# Patient Record
Sex: Female | Born: 1985 | Race: White | Hispanic: No | Marital: Married
Health system: Midwestern US, Community
[De-identification: ages and names within clinical notes are randomized; demographics above are authoritative.]

## PROBLEM LIST (undated history)

## (undated) ENCOUNTER — Inpatient Hospital Stay (HOSPITAL_COMMUNITY): Payer: Self-pay

## (undated) DIAGNOSIS — G43019 Migraine without aura, intractable, without status migrainosus: Principal | ICD-10-CM

## (undated) DIAGNOSIS — G43909 Migraine, unspecified, not intractable, without status migrainosus: Secondary | ICD-10-CM

## (undated) DIAGNOSIS — D649 Anemia, unspecified: Secondary | ICD-10-CM

## (undated) DIAGNOSIS — J45909 Unspecified asthma, uncomplicated: Secondary | ICD-10-CM

## (undated) HISTORY — PX: OTHER SURGICAL HISTORY: SHX169

## (undated) HISTORY — DX: Migraine without aura, intractable, without status migrainosus: G43.019

---

## 2009-07-24 NOTE — Procedures (Signed)
Test Reason : PRE/OP CARDIOVASCULAR EXAM   Blood Pressure : ***/*** mmHG   Vent. Rate : 081 BPM     Atrial Rate : 081 BPM      P-R Int : 136 ms          QRS Dur : 082 ms       QT Int : 370 ms       P-R-T Axes : 079 088 059 degrees      QTc Int : 429 ms   Normal sinus rhythm with sinus arrhythmia   Normal ECG   No previous ECGs available   Confirmed by Francisco Capuchin, M.D., Mohit ([id number]) on 07/24/2009 7:43:00 PM   Referred By:             Overread By: Osa Craver, M.D.

## 2009-07-26 NOTE — Op Note (Signed)
[facility]   Operation Report   NAME:  Destiny Wolf, Destiny Wolf   SEX:   F   DATE: 07/26/2009   DOB: [birth year]   MR#    [id number]   ROOM:     ACCT#  [id number]       cc: Dr. Guadlupe Spanish , Carola Rhine MD       PREOPERATIVE DIAGNOSES:   1. Right-sided pain with mild right hydronephrosis.   2. Lower urinary tract symptoms.   3. Ovarian cysts.       POSTOPERATIVE DIAGNOSES:   1. No significant obstruction of right renal drainage system.   2. Areas of erythema of the bladder.   3. Ovarian cysts.       ANESTHESIA:   General       SURGEON:   Mickel Fuchs, M.D.       PROCEDURE:   Cystoscopy, bilateral retrograde pyelograms, bladder biopsy and fulguration    (small).       NOTE:   This is a 24 year old who had been evaluated by  Dr. Jacklyn Shell.  She has had    symptoms including right-sided pain and has imaging study including CT scan    with mild right dilatation involving primarily the pelvis of right kidney with    minimal right ureteral dilatation.  She also has had lower urinary tract    symptoms of frequency, urgency and sensation is incomplete emptying.  Her pain    has included the right flank, right abdomen and pelvic regions.  She does    have ovarian cysts.  Her procedures have included previous ovarian cyst    removal.  She has had a more recent lap appendectomy.         See H&amp;P by Dr. Terrace Arabia regarding nature of findings and planned procedure.  I did    call the patient last night and reviewed this morning preoperatively the    nature of procedure and risks.       PROCEDURE:   The procedure was done under general anesthesia in lithotomy position after    prep, drape and time-out under fluoroscopic monitoring.         Cystoscopy was performed.  Examination of the bladder with the 30 and 70    degree lens revealed two areas of erythema; one on the left lateral wall and    one on the posterior wall.  These most likely represent inflammatory changes.     No tumors were present in the bladder.  Both ureteral orifices  appeared    normal.       Bilateral retrograde pyelograms were done using the 5-French open-ended and    half-strength contrast.  On the left side, no filling defects and no    hydronephrosis.  On the right side, no filling defects.  There was proper    drainage of the ureter, which was not significantly dilated.  Minimal fullness    of the right renal pelvis without dilatation of the calyces.  There was good    drainage of the right renal pelvis.  Following removal of the 5-French    open-ended, there was good drainage of both the ureter and drainage from the    renal pelvis without evidence of significant obstruction.       Biopsies were obtained using cold-cup from the two areas of erythema of the    bladder.  These areas were thoroughly fulgurated.  The biopsy specimens were    sent for  pathologic assessment.  The bladder was drained.  The patient was    given a B&amp;O suppository.  The patient tolerated the procedure well and was    returned to the recovery room in good condition.       PLAN:   The plan is to give prescription for VESIcare to see if this can help with her    bladder symptoms pending results of the biopsies and her clinical course.           ___________________   P. Genevie Cheshire MD   Dictated By:.    ec   D:07/26/2009   T: 07/27/2009 08:65:78   [id number]

## 2010-02-09 ENCOUNTER — Ambulatory Visit: Payer: Self-pay | Admitting: Gynecology

## 2010-02-09 ENCOUNTER — Inpatient Hospital Stay (HOSPITAL_COMMUNITY): Admission: AD | Admit: 2010-02-09 | Discharge: 2010-02-09 | Payer: Self-pay | Admitting: Obstetrics and Gynecology

## 2010-05-09 ENCOUNTER — Inpatient Hospital Stay (HOSPITAL_COMMUNITY)
Admission: AD | Admit: 2010-05-09 | Discharge: 2010-05-09 | Payer: Self-pay | Source: Home / Self Care | Attending: Obstetrics and Gynecology | Admitting: Obstetrics and Gynecology

## 2010-07-12 LAB — GC/CHLAMYDIA PROBE AMP, GENITAL: Chlamydia, DNA Probe: NEGATIVE

## 2010-07-12 LAB — URINALYSIS, ROUTINE W REFLEX MICROSCOPIC
Bilirubin Urine: NEGATIVE
Ketones, ur: NEGATIVE mg/dL
Nitrite: NEGATIVE
Specific Gravity, Urine: >1.03 — ABNORMAL HIGH (ref 1.005–1.030)
pH: 6 (ref 5.0–8.0)

## 2010-07-12 LAB — DIFFERENTIAL
Basophils Absolute: 0 10*3/uL (ref 0.0–0.1)
Basophils Relative: 1 % (ref 0–1)
Eosinophils Absolute: 0 10*3/uL (ref 0.0–0.7)
Eosinophils Relative: 0 % (ref 0–5)
Monocytes Absolute: 0.4 10*3/uL (ref 0.1–1.0)
Monocytes Relative: 5 % (ref 3–12)

## 2010-07-12 LAB — CBC
HCT: 36.3 % (ref 36.0–46.0)
Hemoglobin: 12.7 g/dL (ref 12.0–15.0)
MCH: 33 pg (ref 26.0–34.0)
MCHC: 35 g/dL (ref 30.0–36.0)
MCV: 94.4 fL (ref 78.0–100.0)
RDW: 13.2 % (ref 11.5–15.5)

## 2010-07-12 LAB — WET PREP, GENITAL
Trich, Wet Prep: NONE SEEN
Yeast Wet Prep HPF POC: NONE SEEN

## 2010-07-12 LAB — URINE MICROSCOPIC-ADD ON

## 2010-08-19 ENCOUNTER — Inpatient Hospital Stay (HOSPITAL_COMMUNITY)
Admission: AD | Admit: 2010-08-19 | Discharge: 2010-08-19 | Disposition: A | Discharge status: Home / Self Care | Payer: Medicaid Other | Source: Ambulatory Visit | Attending: Obstetrics and Gynecology | Admitting: Obstetrics and Gynecology

## 2010-08-19 DIAGNOSIS — R109 Unspecified abdominal pain: Secondary | ICD-10-CM | POA: Insufficient documentation

## 2010-08-19 DIAGNOSIS — O4700 False labor before 37 completed weeks of gestation, unspecified trimester: Secondary | ICD-10-CM | POA: Insufficient documentation

## 2010-08-19 LAB — URINE MICROSCOPIC-ADD ON

## 2010-08-19 LAB — URINALYSIS, ROUTINE W REFLEX MICROSCOPIC
Glucose, UA: NEGATIVE mg/dL
Hgb urine dipstick: NEGATIVE
Ketones, ur: 40 mg/dL — AB
Leukocytes, UA: NEGATIVE
Protein, ur: >300 mg/dL — AB
pH: 6 (ref 5.0–8.0)

## 2010-08-19 LAB — CBC
Hemoglobin: 11.4 g/dL — ABNORMAL LOW (ref 12.0–15.0)
MCHC: 34.1 g/dL (ref 30.0–36.0)
WBC: 15.3 10*3/uL — ABNORMAL HIGH (ref 4.0–10.5)

## 2010-10-06 ENCOUNTER — Inpatient Hospital Stay (HOSPITAL_COMMUNITY)
Admission: AD | Admit: 2010-10-06 | Discharge: 2010-10-08 | DRG: 775 | Disposition: A | Discharge status: Home / Self Care | Payer: Medicaid Other | Source: Ambulatory Visit | Attending: Obstetrics and Gynecology | Admitting: Obstetrics and Gynecology

## 2010-10-06 LAB — CBC
Hemoglobin: 11.7 g/dL — ABNORMAL LOW (ref 12.0–15.0)
MCV: 92.9 fL (ref 78.0–100.0)
Platelets: 252 10*3/uL (ref 150–400)
RBC: 3.64 MIL/uL — ABNORMAL LOW (ref 3.87–5.11)
WBC: 8.4 10*3/uL (ref 4.0–10.5)

## 2010-10-06 LAB — RPR: RPR Ser Ql: NONREACTIVE

## 2010-10-07 LAB — CBC
HCT: 29.8 % — ABNORMAL LOW (ref 36.0–46.0)
Hemoglobin: 10.2 g/dL — ABNORMAL LOW (ref 12.0–15.0)
MCH: 32.2 pg (ref 26.0–34.0)
RBC: 3.17 MIL/uL — ABNORMAL LOW (ref 3.87–5.11)

## 2011-05-29 IMAGING — US US OB TRANSVAGINAL MODIFY
1 series · 14 of 28 positions shown · non-contrast
Comparison: none

OBSTETRICAL ULTRASOUND:
 This ultrasound exam was performed in the [HOSPITAL] Ultrasound Department.  The OB US report was generated in the AS system, and faxed to the ordering physician.  This report is also available in [HOSPITAL]?s Access[employer]ware and in [REDACTED] PACS.

[Series 1: us ob comp less 14 wks · 0.21mm/px · 14 of 51 slices shown]
[im 2/51]
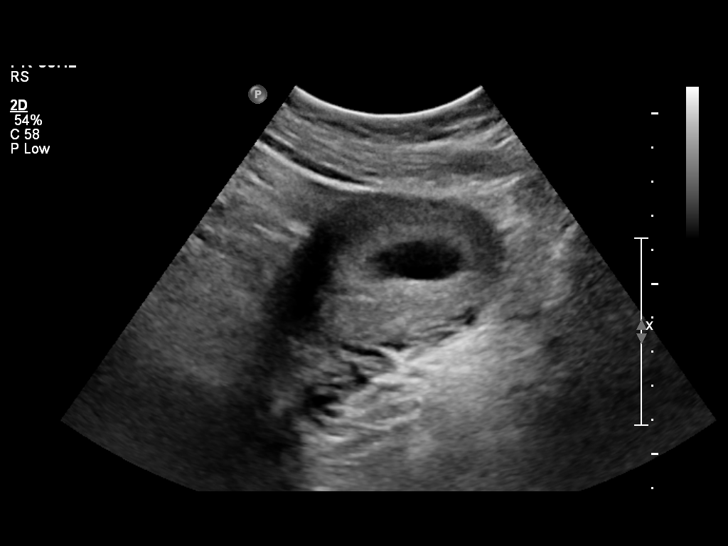
[im 6/51]
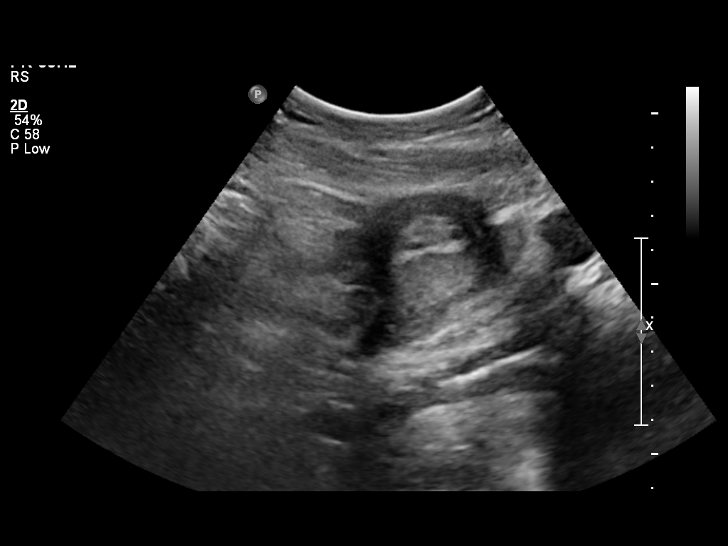
[im 10/51]
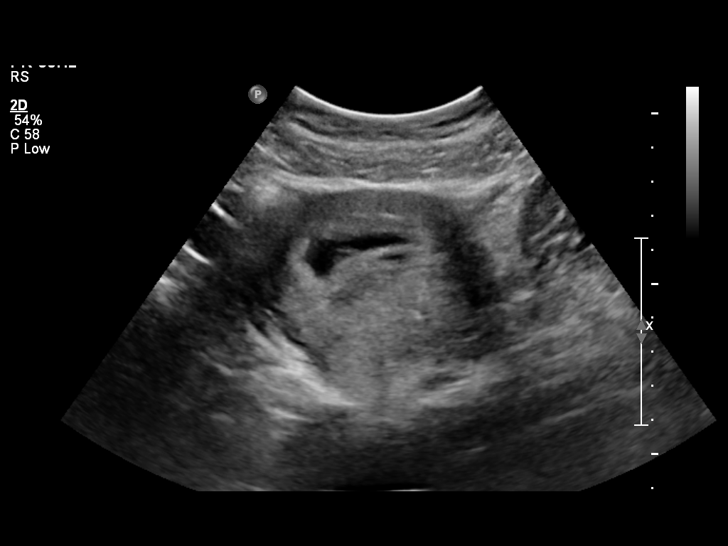
[im 13/51]
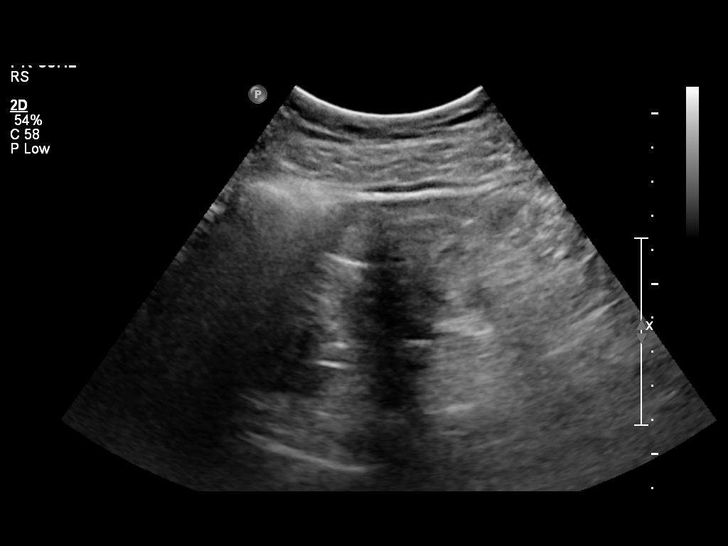
[im 17/51]
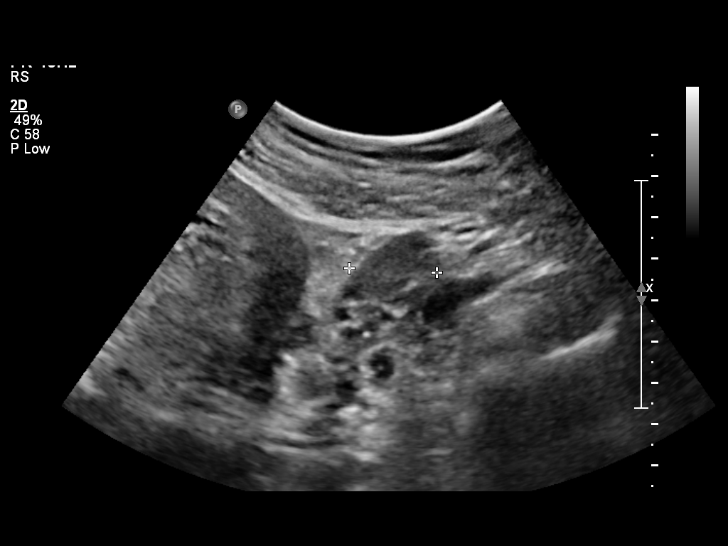
[im 21/51]
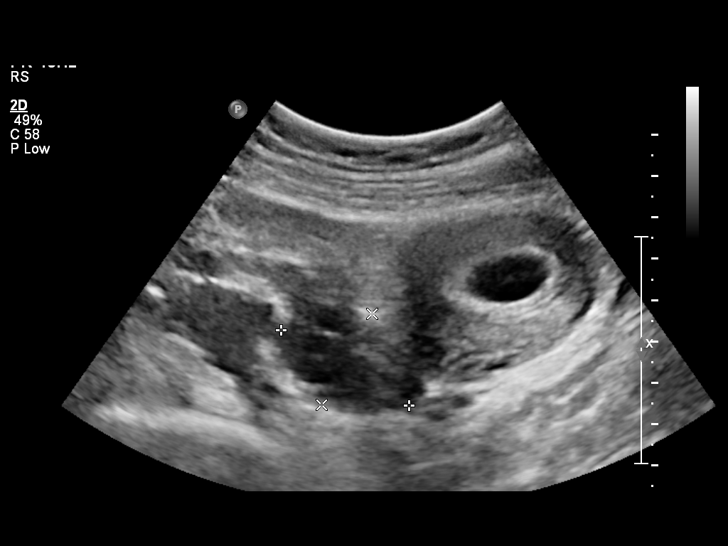
[im 25/51]
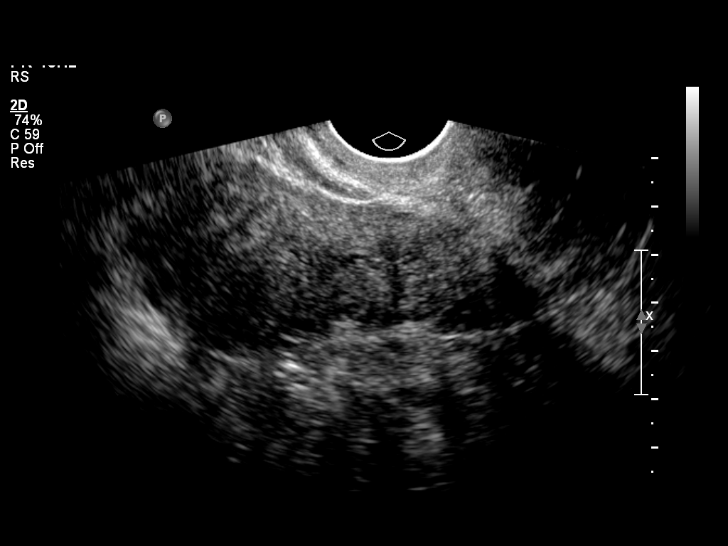
[im 28/51]
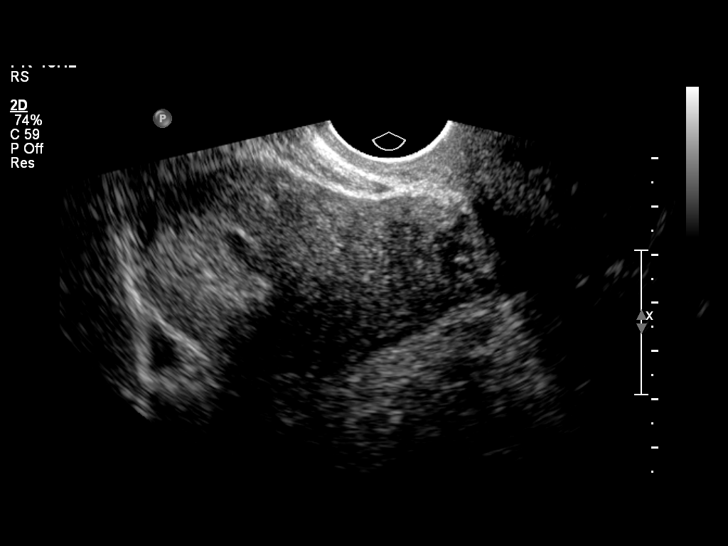
[im 32/51]
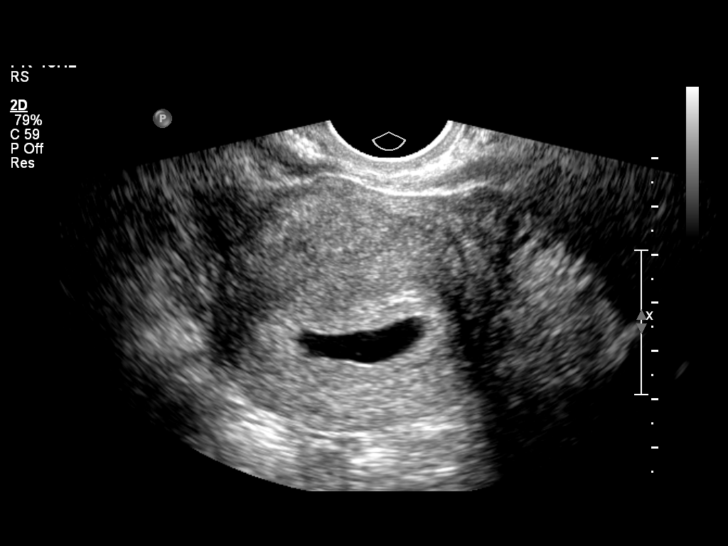
[im 36/51]
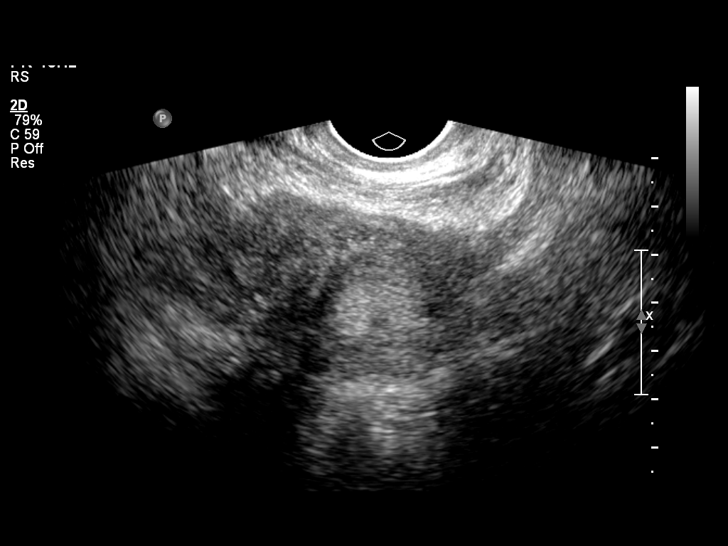
[im 39/51]
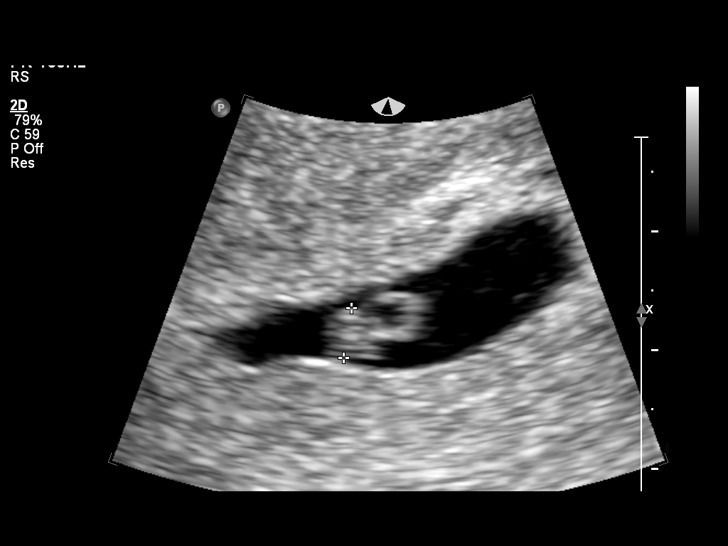
[im 43/51]
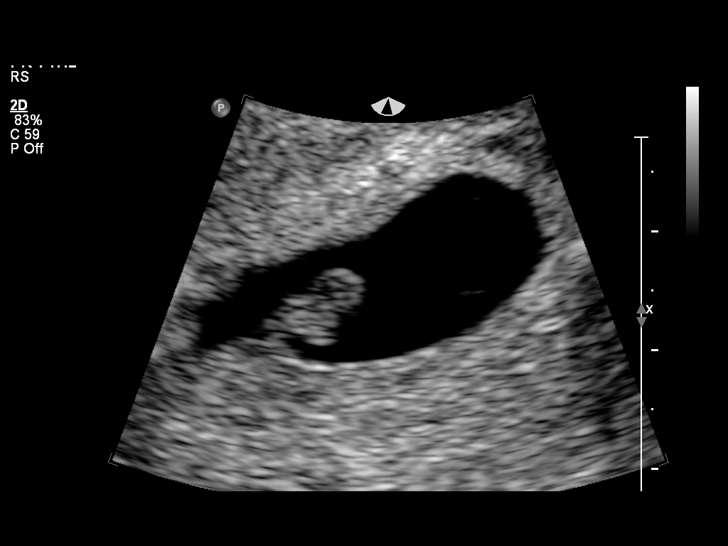
[im 47/51]
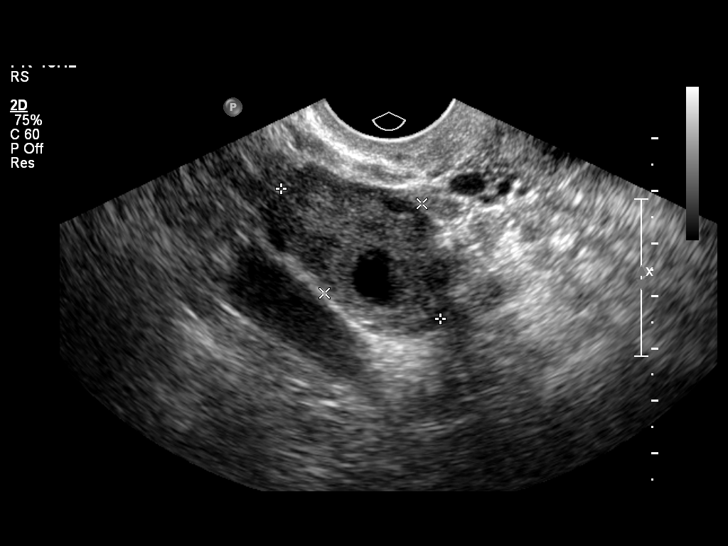
[im 51/51]
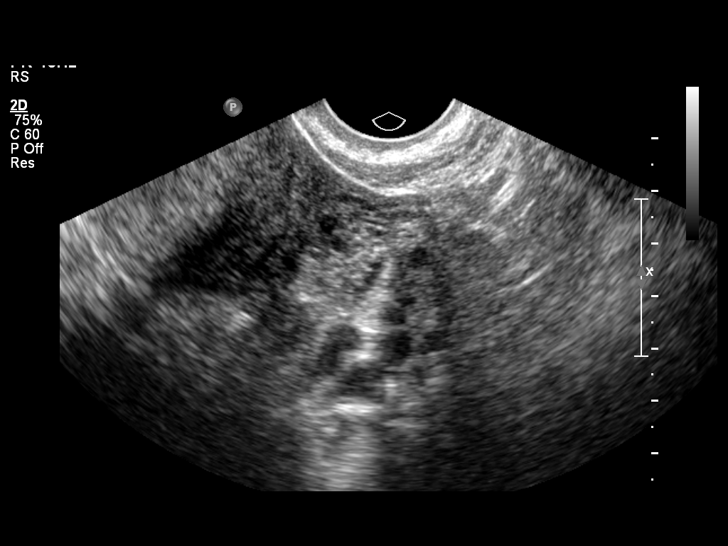

[14 of 28 positions shown; findings below may reference images not displayed]

IMPRESSION: See AS Obstetric US report.

## 2011-11-21 ENCOUNTER — Inpatient Hospital Stay (HOSPITAL_COMMUNITY)
Admission: AD | Admit: 2011-11-21 | Discharge: 2011-11-22 | Disposition: A | Discharge status: Home / Self Care | Payer: Medicaid Other | Source: Ambulatory Visit | Attending: Obstetrics & Gynecology | Admitting: Obstetrics & Gynecology

## 2011-11-21 ENCOUNTER — Encounter (HOSPITAL_COMMUNITY): Payer: Self-pay | Admitting: *Deleted

## 2011-11-21 DIAGNOSIS — O200 Threatened abortion: Secondary | ICD-10-CM

## 2011-11-21 DIAGNOSIS — O209 Hemorrhage in early pregnancy, unspecified: Secondary | ICD-10-CM | POA: Insufficient documentation

## 2011-11-21 HISTORY — DX: Unspecified asthma, uncomplicated: J45.909

## 2011-11-21 HISTORY — DX: Anemia, unspecified: D64.9

## 2011-11-21 LAB — WET PREP, GENITAL
Clue Cells Wet Prep HPF POC: NONE SEEN
Trich, Wet Prep: NONE SEEN

## 2011-11-21 LAB — CBC
MCH: 31.4 pg (ref 26.0–34.0)
MCHC: 34.5 g/dL (ref 30.0–36.0)
Platelets: 265 10*3/uL (ref 150–400)
RDW: 12.8 % (ref 11.5–15.5)

## 2011-11-21 NOTE — MAU Note (Signed)
Pt states she started having vaginal bleeding. Pt states she had some bleeding with her first son, but feels this bleeding is" pretty heavy"

## 2011-11-21 NOTE — MAU Provider Note (Signed)
History     CSN: [id number]  Arrival date and time: 11/21/11 2111   First Provider Initiated Contact with Patient 11/21/11 2240      Chief Complaint  Patient presents with  . Vaginal Bleeding   HPI Linda Kirk is a 26 y.o. female @ [redacted]w[redacted]d gestation who presents to MAU for vaginal bleeding. The bleeding started approximately 7 pm. She describes the bleeding as heavier than a period. Has not used any pads. Noted the bleeding while in the tub. Last sexual intercourse yesterday. Has not started prenatal care. Plans care with Dr. Henderson Cloud but has not had a GYN visit with any one since her last baby. Did not go for a 6 week check up after her last delivery. Unsure of last pap smear. No history of STI's. The history was provided by the patient.   OB History    Grav Para Term Preterm Abortions TAB SAB Ect Mult Living   5 2 2  0 2 0 2 0 0 2      Past Medical History  Diagnosis Date  . Asthma   . Anemia     Past Surgical History  Procedure Date  . Pediatric surgery   . Pyloric stenosis     Family History  Problem Relation Age of Onset  . Hypertension Father   . Hyperlipidemia Father     History  Substance Use Topics  . Smoking status: Never Smoker   . Smokeless tobacco: Not on file  . Alcohol Use: No    Allergies:  Allergies  Allergen Reactions  . Penicillins Anaphylaxis    No prescriptions prior to admission    Review of Systems  Constitutional: Negative for fever, chills and weight loss.  HENT: Negative for ear pain, nosebleeds, congestion, sore throat and neck pain.   Eyes: Negative for blurred vision, double vision, photophobia and pain.  Respiratory: Negative for cough, shortness of breath and wheezing.   Cardiovascular: Negative for chest pain, palpitations and leg swelling.  Gastrointestinal: Negative for heartburn, nausea, vomiting, abdominal pain (cramping ), diarrhea and constipation.  Genitourinary: Negative for dysuria, urgency and frequency.    Musculoskeletal: Positive for back pain. Negative for myalgias.  Skin: Negative for itching and rash.  Neurological: Negative for dizziness, sensory change, speech change, seizures, weakness and headaches.  Endo/Heme/Allergies: Does not bruise/bleed easily.  Psychiatric/Behavioral: Negative for depression. The patient is not nervous/anxious and does not have insomnia.    Physical Exam   Blood pressure 115/87, pulse 119, temperature 99.3 F (37.4 C), temperature source Oral, resp. rate 18, height 5\' 5"  (1.651 m), weight 163 lb 3.2 oz (74.027 kg), last menstrual period 09/04/2011.  Physical Exam  Nursing note and vitals reviewed. Constitutional: She is oriented to person, place, and time. She appears well-developed and well-nourished. No distress.  HENT:  Head: Normocephalic and atraumatic.  Eyes: EOM are normal.  Neck: Neck supple.  Cardiovascular: Regular rhythm.   Respiratory: Effort normal.  GI: Soft. There is no tenderness.  Genitourinary:       External genitalia without lesions. Moderate blood vaginal vault. Cervix closed, long, no CMT, no adnexal tenderness or mass palpated. Uterus slightly enlarged.  Musculoskeletal: Normal range of motion.  Neurological: She is alert and oriented to person, place, and time.  Skin: Skin is warm and dry.  Psychiatric: She has a normal mood and affect. Her behavior is normal. Judgment and thought content normal.   Results for orders placed during the hospital encounter of 11/21/11 (from the past 24  hour(s))  WET PREP, GENITAL     Status: Abnormal   Collection Time   11/21/11 11:05 PM      Component Value Range   Yeast Wet Prep HPF POC NONE SEEN  NONE SEEN   Trich, Wet Prep NONE SEEN  NONE SEEN   Clue Cells Wet Prep HPF POC NONE SEEN  NONE SEEN   WBC, Wet Prep HPF POC FEW (*) NONE SEEN  CBC     Status: Normal   Collection Time   11/21/11 11:15 PM      Component Value Range   WBC 9.4  4.0 - 10.5 K/uL   RBC 4.05  3.87 - 5.11 MIL/uL    Hemoglobin 12.7  12.0 - 15.0 g/dL   HCT 08.6  57.8 - 46.9 %   MCV 90.9  78.0 - 100.0 fL   MCH 31.4  26.0 - 34.0 pg   MCHC 34.5  30.0 - 36.0 g/dL   RDW 62.9  52.8 - 41.3 %   Platelets 265  150 - 400 K/uL  HCG, QUANTITATIVE, PREGNANCY     Status: Abnormal   Collection Time   11/21/11 11:15 PM      Component Value Range   hCG, Beta Chain, Quant, [place] [address] (*) <5 mIU/mL     Procedures Ultrasound tonight shows a 11 week 2 days viable with large [facility]  Assessment: 26 y.o. female with 11.2 week viable IUP   Large [facility]  Plan:  Pelvic rest   Start prenatal care   Return as needed.  I have reviewed this patient's vital signs, nurses notes, appropriate labs and imaging. I have discussed in detail with the patient results of lab and ultrasound and plan of care. Patient voices understanding.    Namiah Dunnavant, RN, FNP, [facility] 11/21/2011, 10:46 PM

## 2011-11-21 NOTE — MAU Note (Signed)
For the past two hours been having bleeding that woke me up out of sleep.  Bleeding like a heavy menstrual period.  Cramping

## 2011-11-21 NOTE — Progress Notes (Signed)
Pt states she had N&V earlier

## 2011-11-21 NOTE — Progress Notes (Signed)
Pt states she fell earlier today.

## 2011-11-22 ENCOUNTER — Inpatient Hospital Stay (HOSPITAL_COMMUNITY): Payer: Medicaid Other

## 2011-11-22 LAB — HCG, QUANTITATIVE, PREGNANCY: hCG, Beta Chain, Quant, S: 76164 m[IU]/mL — ABNORMAL HIGH (ref <5)

## 2011-11-22 LAB — GC/CHLAMYDIA PROBE AMP, GENITAL
Chlamydia, DNA Probe: NEGATIVE
GC Probe Amp, Genital: NEGATIVE

## 2011-11-22 NOTE — MAU Provider Note (Signed)
Attestation of Attending Supervision of Advanced Practitioner (CNM/NP): Evaluation and management procedures were performed by the Advanced Practitioner under my supervision and collaboration.  I have reviewed the Advanced Practitioner's note and chart, and I agree with the management and plan.  Taysom Glymph, M.D. 11/22/2011 7:58 AM  

## 2012-12-11 ENCOUNTER — Inpatient Hospital Stay (HOSPITAL_COMMUNITY)
Admission: AD | Admit: 2012-12-11 | Discharge: 2012-12-11 | Disposition: A | Discharge status: Home / Self Care | Payer: Medicaid Other | Source: Ambulatory Visit | Attending: Obstetrics & Gynecology | Admitting: Obstetrics & Gynecology

## 2012-12-11 ENCOUNTER — Encounter (HOSPITAL_COMMUNITY): Payer: Self-pay

## 2012-12-11 DIAGNOSIS — Z3201 Encounter for pregnancy test, result positive: Secondary | ICD-10-CM

## 2012-12-11 DIAGNOSIS — E876 Hypokalemia: Secondary | ICD-10-CM | POA: Insufficient documentation

## 2012-12-11 DIAGNOSIS — R55 Syncope and collapse: Secondary | ICD-10-CM

## 2012-12-11 DIAGNOSIS — O2650 Maternal hypotension syndrome, unspecified trimester: Secondary | ICD-10-CM | POA: Insufficient documentation

## 2012-12-11 LAB — URINALYSIS, ROUTINE W REFLEX MICROSCOPIC
Bilirubin Urine: NEGATIVE
Ketones, ur: NEGATIVE mg/dL
Nitrite: NEGATIVE
Protein, ur: NEGATIVE mg/dL
Urobilinogen, UA: 0.2 mg/dL (ref 0.0–1.0)

## 2012-12-11 LAB — COMPREHENSIVE METABOLIC PANEL
Alkaline Phosphatase: 47 U/L (ref 39–117)
BUN: 5 mg/dL — ABNORMAL LOW (ref 6–23)
CO2: 24 mEq/L (ref 19–32)
Chloride: 100 mEq/L (ref 96–112)
Creatinine, Ser: 0.53 mg/dL (ref 0.50–1.10)
GFR calc Af Amer: >90 mL/min (ref >90)
GFR calc non Af Amer: >90 mL/min (ref >90)
Glucose, Bld: 84 mg/dL (ref 70–99)
Potassium: 3.1 mEq/L — ABNORMAL LOW (ref 3.5–5.1)
Total Bilirubin: 0.4 mg/dL (ref 0.3–1.2)

## 2012-12-11 LAB — CBC
HCT: 32.6 % — ABNORMAL LOW (ref 36.0–46.0)
Hemoglobin: 11.7 g/dL — ABNORMAL LOW (ref 12.0–15.0)
MCV: 89.8 fL (ref 78.0–100.0)
RBC: 3.63 MIL/uL — ABNORMAL LOW (ref 3.87–5.11)
RDW: 12.8 % (ref 11.5–15.5)
WBC: 8.1 10*3/uL (ref 4.0–10.5)

## 2012-12-11 MED ORDER — POTASSIUM CHLORIDE CRYS ER 20 MEQ PO TBCR: 40.0000 meq | EXTENDED_RELEASE_TABLET | Freq: Two times a day (BID) | ORAL | Status: DC

## 2012-12-11 NOTE — MAU Note (Signed)
Patient states she has had no prenatal care. States she was at work at [employer] and had a period where she got really hot. States she became alert sitting in a chair and a coworker states she was able to answer questions and the episode lasted about 20 minutes. Patient denies pain, bleeding or discharge. Had one episode of vomiting this am.

## 2012-12-11 NOTE — MAU Provider Note (Signed)
Chief Complaint: Near Syncope   None    SUBJECTIVE HPI: Linda Kirk is a 27 y.o. [id number] at [redacted]w[redacted]d by LMP who presents to maternity admissions reporting episode at work tonight with possible loss of consciousness.  Pt reports she has had near syncopal episodes x4-5 in last few weeks but today felt hot, flushed, and remembers starting a task at work, then was sitting down in a chair with her manager beside her.  The episode was witnessed by a co-worker who reports the pt was talking during the episode and did not pass out or fall.  She denies LOF, vaginal bleeding, vaginal itching/burning, urinary symptoms, h/a, dizziness, n/v, or fever/chills.      Past Medical History  Diagnosis Date  . Asthma   . Anemia    Past Surgical History  Procedure Laterality Date  . Pediatric surgery    . Pyloric stenosis     History   Social History  . Marital Status: Single    Spouse Name: N/A    Number of Children: N/A  . Years of Education: N/A   Occupational History  . Not on file.   Social History Main Topics  . Smoking status: Never Smoker   . Smokeless tobacco: Not on file  . Alcohol Use: No  . Drug Use: No  . Sexual Activity: Yes   Other Topics Concern  . Not on file   Social History Narrative  . No narrative on file   No current facility-administered medications on file prior to encounter.   No current outpatient prescriptions on file prior to encounter.   Allergies  Allergen Reactions  . Penicillins Anaphylaxis    ROS: Pertinent items in HPI  OBJECTIVE Blood pressure 116/77, pulse 96, temperature 99.3 F (37.4 C), temperature source Oral, resp. rate 16, height 5\' 5"  (1.651 m), weight 70.67 kg (155 lb 12.8 oz), last menstrual period 08/06/2012, SpO2 100.00%. GENERAL: Well-developed, well-nourished female in no acute distress.  HEENT: Normocephalic HEART: normal rate RESP: normal effort ABDOMEN: Soft, non-tender EXTREMITIES: Nontender, no edema NEURO: Alert and  oriented SPECULUM EXAM: Deferred  FHT 140s by doppler per RN  LAB RESULTS  Results for orders placed during the hospital encounter of 12/11/12 (from the past 168 hour(s))  URINALYSIS, ROUTINE W REFLEX MICROSCOPIC   Collection Time    12/11/12  4:30 PM      Result Value Range   Color, Urine YELLOW  YELLOW   APPearance CLEAR  CLEAR   Specific Gravity, Urine 1.010  1.005 - 1.030   pH 6.5  5.0 - 8.0   Glucose, UA NEGATIVE  NEGATIVE mg/dL   Hgb urine dipstick NEGATIVE  NEGATIVE   Bilirubin Urine NEGATIVE  NEGATIVE   Ketones, ur NEGATIVE  NEGATIVE mg/dL   Protein, ur NEGATIVE  NEGATIVE mg/dL   Urobilinogen, UA 0.2  0.0 - 1.0 mg/dL   Nitrite NEGATIVE  NEGATIVE   Leukocytes, UA NEGATIVE  NEGATIVE  CBC   Collection Time    12/11/12  6:10 PM      Result Value Range   WBC 8.1  4.0 - 10.5 K/uL   RBC 3.63 (*) 3.87 - 5.11 MIL/uL   Hemoglobin 11.7 (*) 12.0 - 15.0 g/dL   HCT 45.4 (*) 09.8 - 11.9 %   MCV 89.8  78.0 - 100.0 fL   MCH 32.2  26.0 - 34.0 pg   MCHC 35.9  30.0 - 36.0 g/dL   RDW 14.7  82.9 - 56.2 %   Platelets  230  150 - 400 K/uL  COMPREHENSIVE METABOLIC PANEL   Collection Time    12/11/12  6:10 PM      Result Value Range   Sodium 134 (*) 135 - 145 mEq/L   Potassium 3.1 (*) 3.5 - 5.1 mEq/L   Chloride 100  96 - 112 mEq/L   CO2 24  19 - 32 mEq/L   Glucose, Bld 84  70 - 99 mg/dL   BUN 5 (*) 6 - 23 mg/dL   Creatinine, Ser 1.61  0.50 - 1.10 mg/dL   Calcium 9.3  8.4 - 09.6 mg/dL   Total Protein 6.8  6.0 - 8.3 g/dL   Albumin 3.5  3.5 - 5.2 g/dL   AST 14  0 - 37 U/L   ALT 6  0 - 35 U/L   Alkaline Phosphatase 47  39 - 117 U/L   Total Bilirubin 0.4  0.3 - 1.2 mg/dL   GFR calc non Af Amer >90  >90 mL/min   GFR calc Af Amer >90  >90 mL/min     ASSESSMENT 1. Hypokalemia   2. Near syncope   3. Positive pregnancy test     PLAN Discharge home K-Dur 40 meq BID F/U as planned with [facility] anatomy U/S scheduled Return to MAU as needed    Medication  List    ASK your doctor about these medications       prenatal multivitamin Tabs tablet  Take 1 tablet by mouth daily at 12 noon.         Follow-up Information   Schedule an appointment as soon as possible for a visit with [facility]. (Or prenatal provider of pt choice. U/S will call you to schedule appointment or call [phone].  Return to MAU as needed.)    Contact information:   [address] [place] [address] [phone]      Sharen Counter Certified Nurse-Midwife 12/11/2012  5:43 PM

## 2012-12-14 ENCOUNTER — Ambulatory Visit (HOSPITAL_COMMUNITY)
Admission: RE | Admit: 2012-12-14 | Discharge: 2012-12-14 | Disposition: A | Discharge status: Home / Self Care | Payer: Medicaid Other | Source: Ambulatory Visit | Attending: Advanced Practice Midwife | Admitting: Advanced Practice Midwife

## 2012-12-14 DIAGNOSIS — Z363 Encounter for antenatal screening for malformations: Secondary | ICD-10-CM | POA: Insufficient documentation

## 2012-12-14 DIAGNOSIS — O358XX0 Maternal care for other (suspected) fetal abnormality and damage, not applicable or unspecified: Secondary | ICD-10-CM | POA: Insufficient documentation

## 2012-12-14 DIAGNOSIS — Z3201 Encounter for pregnancy test, result positive: Secondary | ICD-10-CM

## 2012-12-14 DIAGNOSIS — Z1389 Encounter for screening for other disorder: Secondary | ICD-10-CM | POA: Insufficient documentation

## 2012-12-17 NOTE — MAU Provider Note (Signed)
Attestation of Attending Supervision of Advanced Practitioner (CNM/NP): Evaluation and management procedures were performed by the Advanced Practitioner under my supervision and collaboration.  I have reviewed the Advanced Practitioner's note and chart, and I agree with the management and plan.  HARRAWAY-SMITH, Jebidiah Baggerly 2:08 PM       

## 2013-01-22 ENCOUNTER — Inpatient Hospital Stay (HOSPITAL_COMMUNITY)
Admission: AD | Admit: 2013-01-22 | Discharge: 2013-01-22 | Disposition: A | Discharge status: Home / Self Care | Payer: Medicaid Other | Source: Ambulatory Visit | Attending: Obstetrics & Gynecology | Admitting: Obstetrics & Gynecology

## 2013-01-22 ENCOUNTER — Encounter (HOSPITAL_COMMUNITY): Payer: Self-pay | Admitting: *Deleted

## 2013-01-22 DIAGNOSIS — M549 Dorsalgia, unspecified: Secondary | ICD-10-CM | POA: Insufficient documentation

## 2013-01-22 DIAGNOSIS — O99891 Other specified diseases and conditions complicating pregnancy: Secondary | ICD-10-CM | POA: Insufficient documentation

## 2013-01-22 DIAGNOSIS — R109 Unspecified abdominal pain: Secondary | ICD-10-CM | POA: Insufficient documentation

## 2013-01-22 LAB — URINALYSIS, ROUTINE W REFLEX MICROSCOPIC
Ketones, ur: NEGATIVE mg/dL
Leukocytes, UA: NEGATIVE
Protein, ur: NEGATIVE mg/dL
Urobilinogen, UA: 0.2 mg/dL (ref 0.0–1.0)

## 2013-01-22 LAB — WET PREP, GENITAL: Yeast Wet Prep HPF POC: NONE SEEN

## 2013-01-22 MED ORDER — ACETAMINOPHEN 500 MG PO TABS: 500.0000 mg | ORAL_TABLET | Freq: Four times a day (QID) | ORAL | Status: DC | PRN

## 2013-01-22 NOTE — MAU Note (Signed)
Patient states she has had no prenatal care pending Medicaid approval. Plans to go to [facility]. Patient states she has been having lower abdominal and low back pain for several days and nausea today. Denies bleeding or discharge. Has felt fetal movement.

## 2013-01-22 NOTE — MAU Provider Note (Signed)
History     CSN: [id number]  Arrival date and time: 01/22/13 1635   None     Chief Complaint  Patient presents with  . Abdominal Pain  . Back Pain  . Nausea   HPI Comments: CIARA KAGAN is a 27 y.o. [phone] at [redacted]w[redacted]d who presents with mild pain in back and abdomen. She describes the pain as crampy and coming and going. It started a couple of days ago and moves from her lower abdomen to her back. The pain occurs every 10-15 minutes. She also states that she has felt contractions a few nights ago. She reports no vaginal bleeding, discharge or leaking of fluid. She has felt good fetal movement. She is currently feeling a little pain in sides of her abdomen. She is trying to get prenatal care at [facility] but has had problems with insurance.   OB History   Grav Para Term Preterm Abortions TAB SAB Ect Mult Living   5 2 2  0 2 0 2 0 0 2      Past Medical History  Diagnosis Date  . Asthma   . Anemia     Past Surgical History  Procedure Laterality Date  . Pediatric surgery    . Pyloric stenosis      Family History  Problem Relation Age of Onset  . Hypertension Father   . Hyperlipidemia Father     History  Substance Use Topics  . Smoking status: Never Smoker   . Smokeless tobacco: Not on file  . Alcohol Use: No    Allergies:  Allergies  Allergen Reactions  . Penicillins Anaphylaxis    Prescriptions prior to admission  Medication Sig Dispense Refill  . Prenatal Vit-Fe Fumarate-FA (PRENATAL MULTIVITAMIN) TABS tablet Take 1 tablet by mouth daily at 12 noon.        Review of Systems  Gastrointestinal: Positive for abdominal pain.  Genitourinary: Negative for dysuria.  Musculoskeletal: Positive for back pain.   Physical Exam   Blood pressure 120/73, pulse 102, temperature 99.1 F (37.3 C), temperature source Oral, resp. rate 16, height 5' 4.5" (1.638 m), weight 73.029 kg (161 lb), last menstrual period 08/06/2012, SpO2 100.00%.  Physical Exam   Constitutional: She is oriented to person, place, and time. She appears well-developed and well-nourished. No distress.  Cardiovascular: Normal rate, regular rhythm, normal heart sounds and intact distal pulses.   Respiratory: Effort normal and breath sounds normal. No respiratory distress. She has no wheezes. She has no rales.  GI: Soft. Bowel sounds are normal. There is no tenderness. There is no rebound and no guarding.  Genitourinary: No bleeding around the vagina. Vaginal discharge found.  Musculoskeletal: Normal range of motion. She exhibits no edema and no tenderness.  Neurological: She is alert and oriented to person, place, and time.  Skin: Skin is warm and dry. She is not diaphoretic. No erythema.   Cervix: Closed, thick FHT: 135, moderate variability, +accels, +variable decels, Category II tracing UC:  None  MAU Course  Procedures  MDM - Urinalysis and wet prep obtained and were not suggestive of infection - Gonorrhea/chlamydia pending - Cervix checked and closed. No LOF of foul odors noticed on speculum exam  Assessment and Plan  DARLISHA KELM is a 27 y.o. [id number] at [redacted]w[redacted]d who presents with cramping.  Plan - counseled patient to keep well hydrated with water - encouraged patient to come to clinic downstairs if not able to establish prenatal care soon - tylenol for pain - counseled  to return to MAU if symptoms persist or worsen  Jacquelin Hawking 01/22/2013, 5:48 PM   I spoke with and examined patient and agree with resident's note and plan of care. No obvious etiology for occasional pelvic pressure. May be related to atypical round ligament pain. No evidence of preterm labor, and pt has no evidence of infection. Encouraged pt to establish prenatal care. Pt agrees and will make appt with our clinic if unable to establish by 28wks.  Tawana Scale, MD OB Fellow

## 2013-02-04 NOTE — MAU Provider Note (Signed)
Attestation of Attending Supervision of Fellow: Evaluation and management procedures were performed by the Fellow under my supervision and collaboration.  I have reviewed the Fellow's note and chart, and I agree with the management and plan.    

## 2013-02-15 LAB — OB RESULTS CONSOLE GC/CHLAMYDIA
Chlamydia: NEGATIVE
GC PROBE AMP, GENITAL: NEGATIVE

## 2013-02-19 LAB — OB RESULTS CONSOLE ANTIBODY SCREEN: ANTIBODY SCREEN: NEGATIVE

## 2013-02-19 LAB — OB RESULTS CONSOLE ABO/RH: RH Type: POSITIVE

## 2013-02-19 LAB — OB RESULTS CONSOLE HIV ANTIBODY (ROUTINE TESTING): HIV: NONREACTIVE

## 2013-02-19 LAB — OB RESULTS CONSOLE RPR: RPR: NONREACTIVE

## 2013-02-19 LAB — OB RESULTS CONSOLE RUBELLA ANTIBODY, IGM: Rubella: IMMUNE

## 2013-02-19 LAB — OB RESULTS CONSOLE HEPATITIS B SURFACE ANTIGEN: Hepatitis B Surface Ag: NEGATIVE

## 2013-03-10 IMAGING — US US OB COMP LESS 14 WK
1 series · 14 of 21 positions shown · non-contrast
Comparison: None.

CLINICAL DATA: Vaginal bleeding.  History of spontaneous abortion.
Beta HCG level [DATE].

OBSTETRIC <14 WK ULTRASOUND
TECHNIQUE: Transabdominal ultrasound was performed for evaluation
of the gestation as well as the maternal uterus and adnexal
regions.

[Series 1: us ob comp less 14 wks · 14 of 21 slices shown]
[im 1/21]
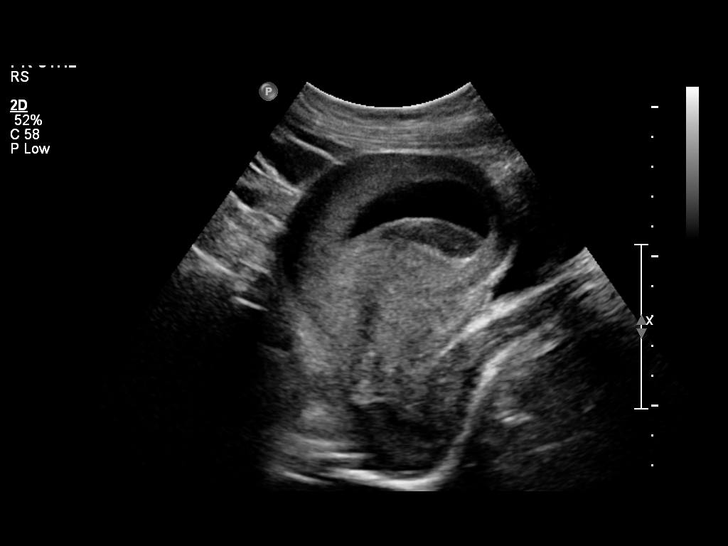
[im 3/21]
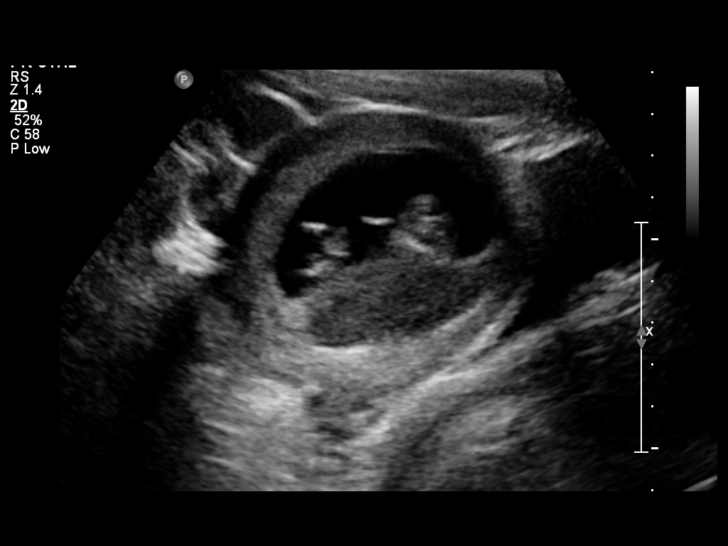
[im 4/21]
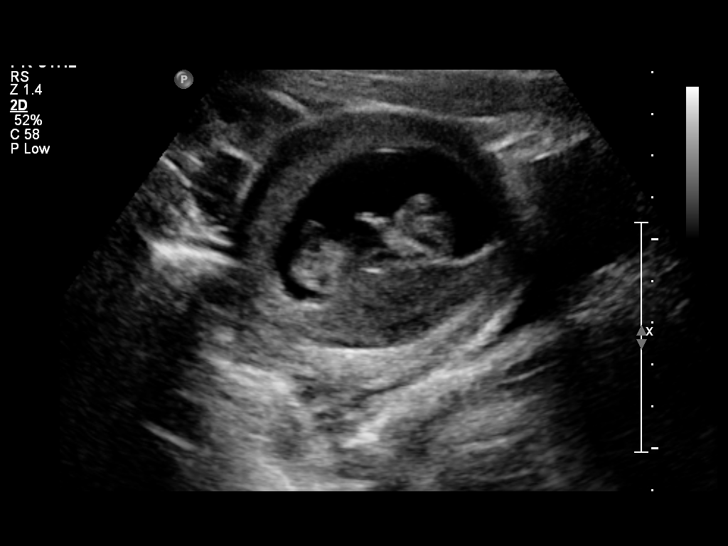
[im 6/21]
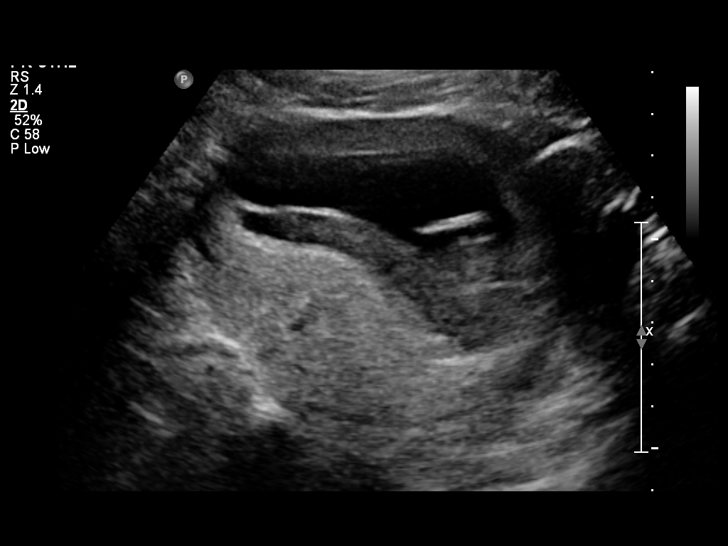
[im 7/21]
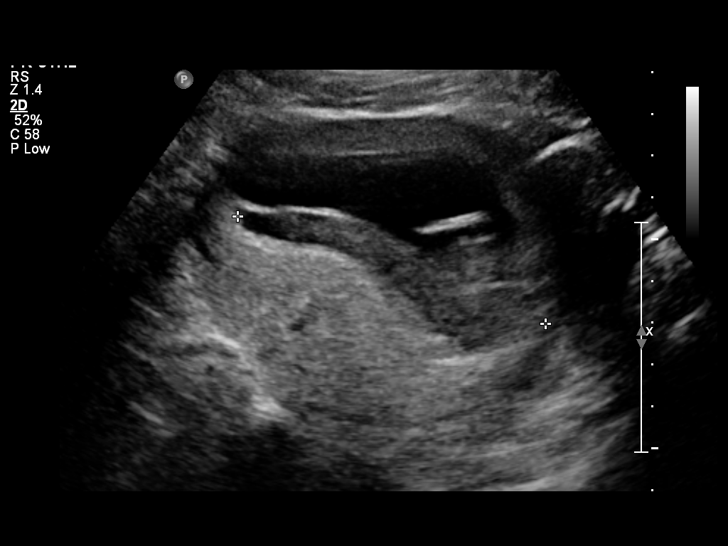
[im 9/21]
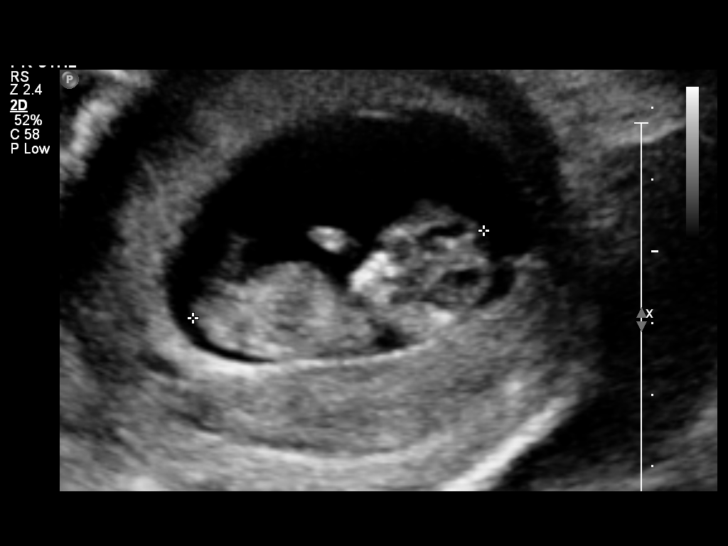
[im 10/21]
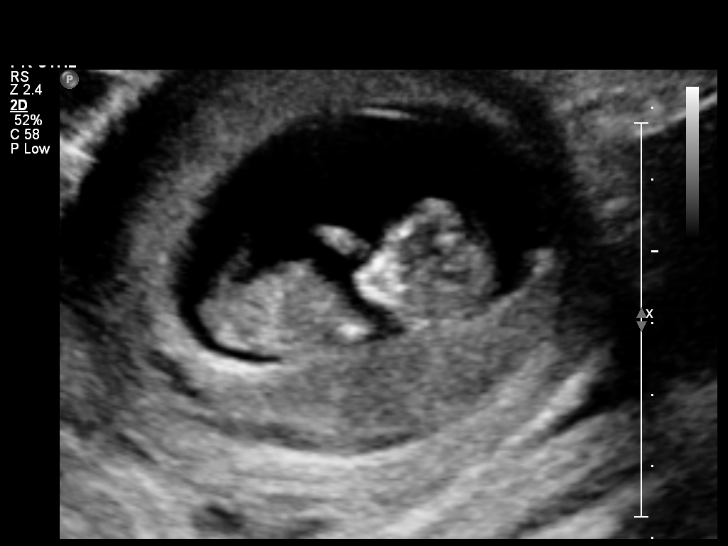
[im 12/21]
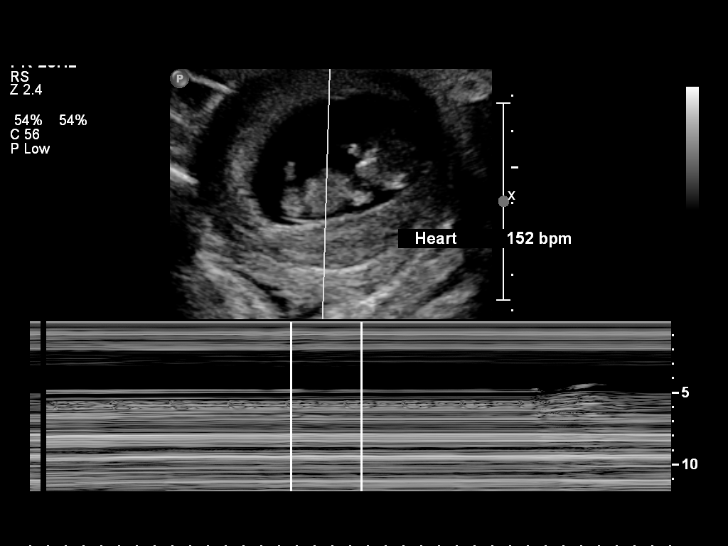
[im 13/21]
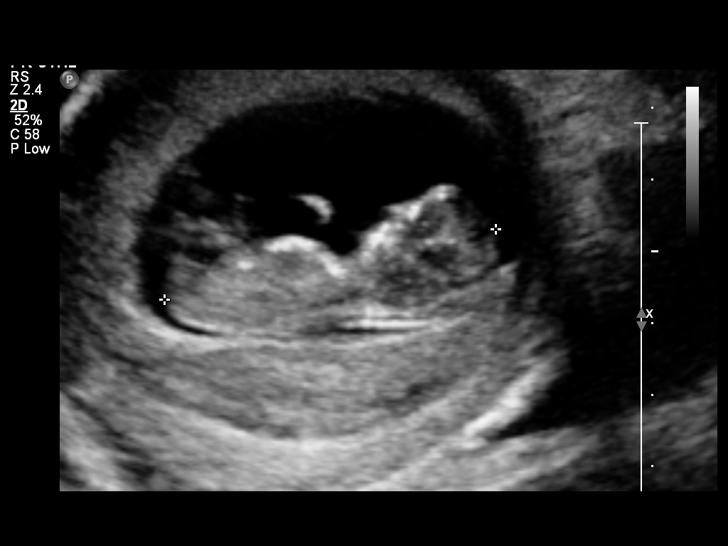
[im 15/21]
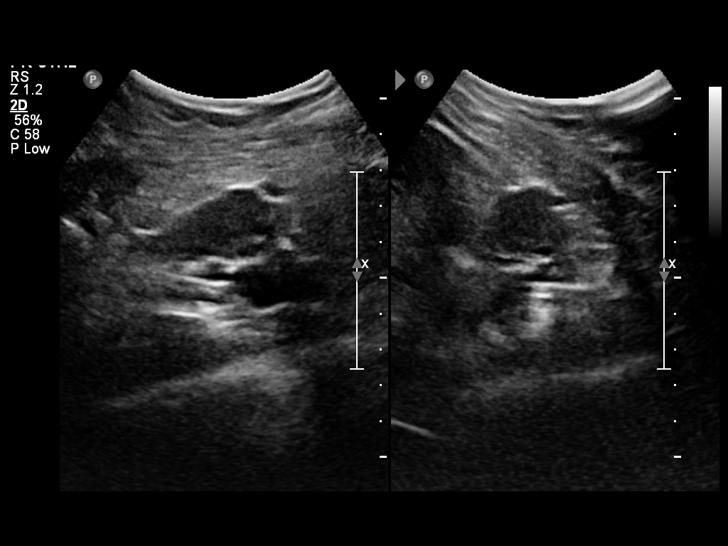
[im 16/21]
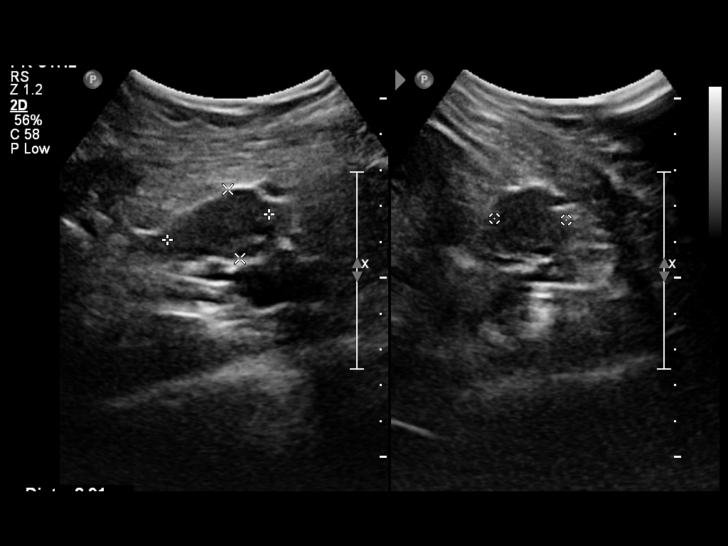
[im 18/21]
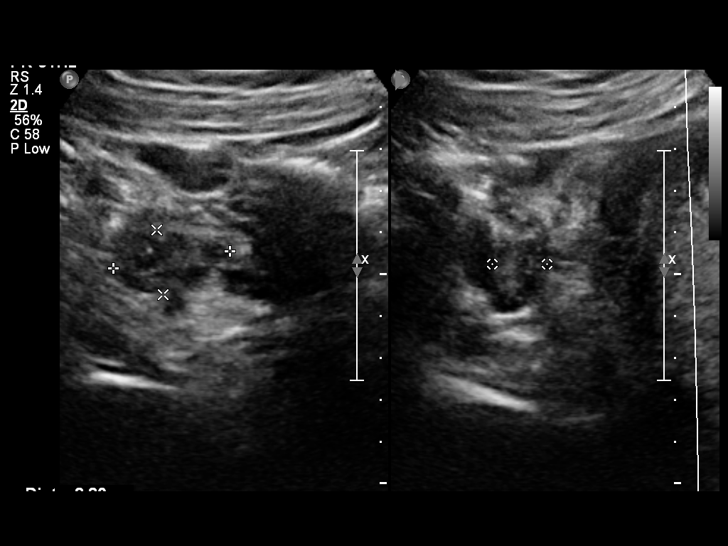
[im 19/21]
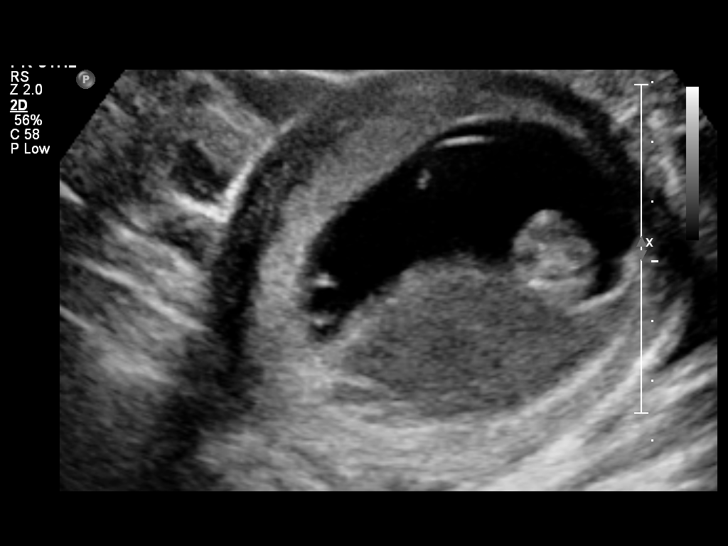
[im 21/21]
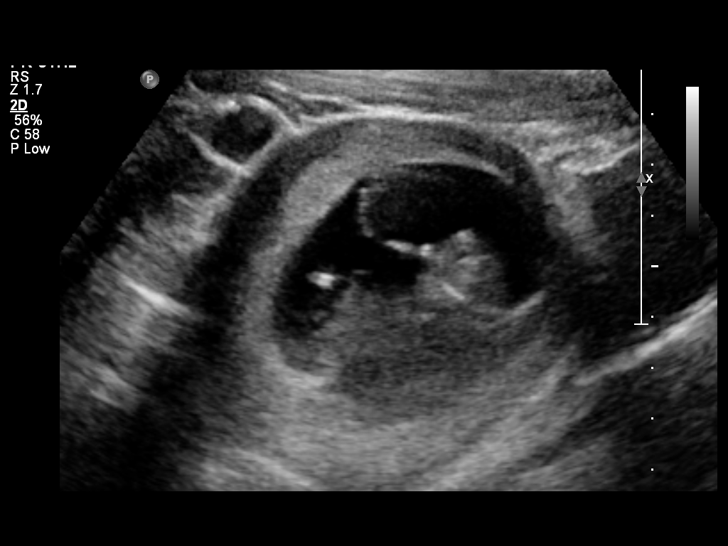

[14 of 21 positions shown; findings below may reference images not displayed]

Intrauterine gestational sac: Visualized/normal in shape.
Yolk sac: Visualized.
Embryo: Single.
Cardiac Activity: Present.
Heart Rate: 152 bpm

CRL:  4.4 cm; 11 weeks 2 days.            US EDC: 06/10/2012.

Maternal uterus/Adnexae:
There is a moderate to large subchorionic hematoma, measuring
approximately 5.2 x 1.7 x 7.8 cm.  Both maternal ovaries appear
normal.  There is no free pelvic fluid.
IMPRESSION: 1.  Moderate to large subchorionic hematoma.
2.  Single live intrauterine gestation with best estimated
gestational age of 11 weeks 2 days.

## 2013-04-19 LAB — OB RESULTS CONSOLE GBS: GBS: POSITIVE

## 2013-04-29 NOTE — L&D Delivery Note (Signed)
Patient was C/C/+3 and pushed for 5 minutes with epidural.   NSVD  female infant, Apgars 8,9, weight P.   Meconium seen but baby vigorous immediately. The patient had no lacerations. Fundus was firm. EBL was expected. Placenta was delivered intact. Vagina was clear.  Baby was vigorous and doing skin to skin with mother.  Linda Kirk A

## 2013-05-07 ENCOUNTER — Inpatient Hospital Stay (HOSPITAL_COMMUNITY): Payer: Medicaid Other | Admitting: Anesthesiology

## 2013-05-07 ENCOUNTER — Encounter (HOSPITAL_COMMUNITY): Payer: Self-pay | Admitting: *Deleted

## 2013-05-07 ENCOUNTER — Inpatient Hospital Stay (HOSPITAL_COMMUNITY)
Admission: AD | Admit: 2013-05-07 | Discharge: 2013-05-09 | DRG: 775 | Disposition: A | Discharge status: Home / Self Care | Payer: Medicaid Other | Source: Ambulatory Visit | Attending: Obstetrics and Gynecology | Admitting: Obstetrics and Gynecology

## 2013-05-07 ENCOUNTER — Encounter (HOSPITAL_COMMUNITY): Payer: Medicaid Other | Admitting: Anesthesiology

## 2013-05-07 DIAGNOSIS — O9989 Other specified diseases and conditions complicating pregnancy, childbirth and the puerperium: Principal | ICD-10-CM

## 2013-05-07 DIAGNOSIS — O99892 Other specified diseases and conditions complicating childbirth: Principal | ICD-10-CM | POA: Diagnosis present

## 2013-05-07 DIAGNOSIS — Z22330 Carrier of Group B streptococcus: Secondary | ICD-10-CM

## 2013-05-07 LAB — CBC
HEMATOCRIT: 33.2 % — AB (ref 36.0–46.0)
Hemoglobin: 11.5 g/dL — ABNORMAL LOW (ref 12.0–15.0)
MCH: 30.1 pg (ref 26.0–34.0)
MCHC: 34.6 g/dL (ref 30.0–36.0)
MCV: 86.9 fL (ref 78.0–100.0)
Platelets: 266 10*3/uL (ref 150–400)
RBC: 3.82 MIL/uL — ABNORMAL LOW (ref 3.87–5.11)
RDW: 12.9 % (ref 11.5–15.5)
WBC: 8.1 10*3/uL (ref 4.0–10.5)

## 2013-05-07 LAB — RPR: RPR Ser Ql: NONREACTIVE

## 2013-05-07 LAB — POCT FERN TEST: POCT Fern Test: POSITIVE

## 2013-05-07 MED ORDER — VANCOMYCIN HCL IN DEXTROSE 1-5 GM/200ML-% IV SOLN
1000.0000 mg | Freq: Two times a day (BID) | INTRAVENOUS | Status: DC
  Administered 2013-05-07: 1000 mg via INTRAVENOUS
  Filled 2013-05-07 (×2): qty 200

## 2013-05-07 MED ORDER — DIBUCAINE 1 % RE OINT
1.0000 "application " | TOPICAL_OINTMENT | RECTAL | Status: DC | PRN
  Filled 2013-05-07: qty 28

## 2013-05-07 MED ORDER — METHYLERGONOVINE MALEATE 0.2 MG/ML IJ SOLN: 0.2000 mg | INTRAMUSCULAR | Status: DC | PRN

## 2013-05-07 MED ORDER — CITRIC ACID-SODIUM CITRATE 334-500 MG/5ML PO SOLN: 30.0000 mL | ORAL | Status: DC | PRN

## 2013-05-07 MED ORDER — MAGNESIUM HYDROXIDE 400 MG/5ML PO SUSP: 30.0000 mL | ORAL | Status: DC | PRN

## 2013-05-07 MED ORDER — ACETAMINOPHEN 325 MG PO TABS: 650.0000 mg | ORAL_TABLET | ORAL | Status: DC | PRN

## 2013-05-07 MED ORDER — OXYCODONE-ACETAMINOPHEN 5-325 MG PO TABS: 1.0000 | ORAL_TABLET | ORAL | Status: DC | PRN

## 2013-05-07 MED ORDER — LACTATED RINGERS IV SOLN
INTRAVENOUS | Status: DC
  Administered 2013-05-07: 05:00:00 via INTRAVENOUS

## 2013-05-07 MED ORDER — MEASLES, MUMPS & RUBELLA VAC ~~LOC~~ INJ: 0.5000 mL | INJECTION | Freq: Once | SUBCUTANEOUS | Status: DC

## 2013-05-07 MED ORDER — FENTANYL 2.5 MCG/ML BUPIVACAINE 1/10 % EPIDURAL INFUSION (WH - ANES)
14.0000 mL/h | INTRAMUSCULAR | Status: DC | PRN
  Administered 2013-05-07: 14 mL/h via EPIDURAL

## 2013-05-07 MED ORDER — ONDANSETRON HCL 4 MG/2ML IJ SOLN: 4.0000 mg | INTRAMUSCULAR | Status: DC | PRN

## 2013-05-07 MED ORDER — SODIUM CHLORIDE 0.9 % IJ SOLN: 3.0000 mL | Freq: Two times a day (BID) | INTRAMUSCULAR | Status: DC

## 2013-05-07 MED ORDER — ONDANSETRON HCL 4 MG PO TABS
4.0000 mg | ORAL_TABLET | ORAL | Status: DC | PRN
Start: 2013-05-07 — End: 2013-05-09

## 2013-05-07 MED ORDER — LANOLIN HYDROUS EX OINT: TOPICAL_OINTMENT | CUTANEOUS | Status: DC | PRN

## 2013-05-07 MED ORDER — METHYLERGONOVINE MALEATE 0.2 MG PO TABS: 0.2000 mg | ORAL_TABLET | ORAL | Status: DC | PRN

## 2013-05-07 MED ORDER — ZOLPIDEM TARTRATE 5 MG PO TABS
5.0000 mg | ORAL_TABLET | Freq: Every evening | ORAL | Status: DC | PRN
Start: 2013-05-07 — End: 2013-05-09

## 2013-05-07 MED ORDER — SODIUM BICARBONATE 8.4 % IV SOLN
INTRAVENOUS | Status: DC | PRN
  Administered 2013-05-07: 5 mL via EPIDURAL

## 2013-05-07 MED ORDER — BENZOCAINE-MENTHOL 20-0.5 % EX AERO
1.0000 "application " | INHALATION_SPRAY | CUTANEOUS | Status: DC | PRN
  Filled 2013-05-07: qty 56

## 2013-05-07 MED ORDER — FLEET ENEMA 7-19 GM/118ML RE ENEM: 1.0000 | ENEMA | RECTAL | Status: DC | PRN

## 2013-05-07 MED ORDER — PRENATAL MULTIVITAMIN CH
1.0000 | ORAL_TABLET | Freq: Every day | ORAL | Status: DC
  Administered 2013-05-07 – 2013-05-08 (×2): 1 via ORAL
  Filled 2013-05-07 (×2): qty 1

## 2013-05-07 MED ORDER — WITCH HAZEL-GLYCERIN EX PADS: 1.0000 "application " | MEDICATED_PAD | CUTANEOUS | Status: DC | PRN

## 2013-05-07 MED ORDER — EPHEDRINE 5 MG/ML INJ: 10.0000 mg | INTRAVENOUS | Status: DC | PRN

## 2013-05-07 MED ORDER — PHENYLEPHRINE 40 MCG/ML (10ML) SYRINGE FOR IV PUSH (FOR BLOOD PRESSURE SUPPORT): 80.0000 ug | PREFILLED_SYRINGE | INTRAVENOUS | Status: DC | PRN

## 2013-05-07 MED ORDER — SENNOSIDES-DOCUSATE SODIUM 8.6-50 MG PO TABS
2.0000 | ORAL_TABLET | ORAL | Status: DC
  Administered 2013-05-07 – 2013-05-08 (×2): 2 via ORAL
  Filled 2013-05-07 (×2): qty 2

## 2013-05-07 MED ORDER — SODIUM CHLORIDE 0.9 % IJ SOLN: 3.0000 mL | INTRAMUSCULAR | Status: DC | PRN

## 2013-05-07 MED ORDER — ONDANSETRON HCL 4 MG/2ML IJ SOLN: 4.0000 mg | Freq: Four times a day (QID) | INTRAMUSCULAR | Status: DC | PRN

## 2013-05-07 MED ORDER — OXYTOCIN 40 UNITS IN LACTATED RINGERS INFUSION - SIMPLE MED
62.5000 mL/h | INTRAVENOUS | Status: DC
  Administered 2013-05-07: 62.5 mL/h via INTRAVENOUS
  Filled 2013-05-07: qty 1000

## 2013-05-07 MED ORDER — DIPHENHYDRAMINE HCL 25 MG PO CAPS
25.0000 mg | ORAL_CAPSULE | Freq: Four times a day (QID) | ORAL | Status: DC | PRN
Start: 2013-05-07 — End: 2013-05-09

## 2013-05-07 MED ORDER — OXYTOCIN BOLUS FROM INFUSION: 500.0000 mL | INTRAVENOUS | Status: DC

## 2013-05-07 MED ORDER — LACTATED RINGERS IV SOLN: 500.0000 mL | Freq: Once | INTRAVENOUS | Status: DC

## 2013-05-07 MED ORDER — FERROUS SULFATE 325 (65 FE) MG PO TABS
325.0000 mg | ORAL_TABLET | Freq: Two times a day (BID) | ORAL | Status: DC
  Administered 2013-05-07 – 2013-05-09 (×4): 325 mg via ORAL
  Filled 2013-05-07 (×4): qty 1

## 2013-05-07 MED ORDER — IBUPROFEN 600 MG PO TABS: 600.0000 mg | ORAL_TABLET | Freq: Four times a day (QID) | ORAL | Status: DC | PRN

## 2013-05-07 MED ORDER — SIMETHICONE 80 MG PO CHEW: 80.0000 mg | CHEWABLE_TABLET | ORAL | Status: DC | PRN

## 2013-05-07 MED ORDER — IBUPROFEN 800 MG PO TABS
800.0000 mg | ORAL_TABLET | Freq: Three times a day (TID) | ORAL | Status: DC
  Administered 2013-05-07 – 2013-05-09 (×5): 800 mg via ORAL
  Filled 2013-05-07 (×5): qty 1

## 2013-05-07 MED ORDER — EPHEDRINE 5 MG/ML INJ
INTRAVENOUS | Status: AC
  Filled 2013-05-07: qty 4

## 2013-05-07 MED ORDER — DIPHENHYDRAMINE HCL 50 MG/ML IJ SOLN: 12.5000 mg | INTRAMUSCULAR | Status: DC | PRN

## 2013-05-07 MED ORDER — FENTANYL 2.5 MCG/ML BUPIVACAINE 1/10 % EPIDURAL INFUSION (WH - ANES)
INTRAMUSCULAR | Status: AC
  Filled 2013-05-07: qty 125

## 2013-05-07 MED ORDER — SODIUM CHLORIDE 0.9 % IV SOLN: 250.0000 mL | INTRAVENOUS | Status: DC | PRN

## 2013-05-07 MED ORDER — LACTATED RINGERS IV SOLN: 500.0000 mL | INTRAVENOUS | Status: DC | PRN

## 2013-05-07 MED ORDER — TETANUS-DIPHTH-ACELL PERTUSSIS 5-2.5-18.5 LF-MCG/0.5 IM SUSP: 0.5000 mL | Freq: Once | INTRAMUSCULAR | Status: DC

## 2013-05-07 MED ORDER — PHENYLEPHRINE 40 MCG/ML (10ML) SYRINGE FOR IV PUSH (FOR BLOOD PRESSURE SUPPORT)
PREFILLED_SYRINGE | INTRAVENOUS | Status: AC
  Filled 2013-05-07: qty 10

## 2013-05-07 MED ORDER — LIDOCAINE HCL (PF) 1 % IJ SOLN
30.0000 mL | INTRAMUSCULAR | Status: DC | PRN
  Filled 2013-05-07: qty 30

## 2013-05-07 NOTE — Progress Notes (Signed)
UR chart review completed.  

## 2013-05-07 NOTE — Anesthesia Procedure Notes (Signed)

## 2013-05-07 NOTE — Progress Notes (Signed)
EFM removed for epidural insertion. 

## 2013-05-07 NOTE — Anesthesia Preprocedure Evaluation (Signed)

## 2013-05-07 NOTE — Progress Notes (Signed)
Pt sitting up for epidural insertion

## 2013-05-07 NOTE — Anesthesia Postprocedure Evaluation (Signed)
  Anesthesia Post-op Note  Anesthesia Post Note  Patient: Linda Kirk  Procedure(s) Performed: * No procedures listed *  Anesthesia type: Epidural  Patient location: Mother/Baby  Post pain: Pain level controlled  Post assessment: Post-op Vital signs reviewed  Last Vitals:  Filed Vitals:   05/07/13 1650  BP: 124/83  Pulse: 79  Temp: 36.8 C  Resp: 18    Post vital signs: Reviewed  Level of consciousness:alert  Complications: No apparent anesthesia complications

## 2013-05-07 NOTE — H&P (Signed)
28 y.o. 3936w1d  [id number] comes in c/o SROM.  Otherwise has good fetal movement and no bleeding.  Past Medical History  Diagnosis Date  . Asthma   . Anemia     Past Surgical History  Procedure Laterality Date  . Pediatric surgery    . Pyloric stenosis      OB History  Gravida Para Term Preterm AB SAB TAB Ectopic Multiple Living  5 2 2  0 2 2 0 0 0 2    # Outcome Date GA Lbr Len/2nd Weight Sex Delivery Anes PTL Lv  5 CUR           4 SAB           3 SAB           2 TRM           1 TRM               History   Social History  . Marital Status: Single    Spouse Name: N/A    Number of Children: N/A  . Years of Education: N/A   Occupational History  . Not on file.   Social History Main Topics  . Smoking status: Never Smoker   . Smokeless tobacco: Not on file  . Alcohol Use: No  . Drug Use: No  . Sexual Activity: Yes   Other Topics Concern  . Not on file   Social History Narrative  . No narrative on file   Penicillins    Prenatal Transfer Tool  Maternal Diabetes: No Genetic Screening: Declined Maternal Ultrasounds/Referrals: Normal Fetal Ultrasounds or other Referrals:  None Maternal Substance Abuse:  No Significant Maternal Medications:  Vancomycin intrapartum Significant Maternal Lab Results: Lab values include: Group B Strep positive  Other PNC: uncomplicated.    Filed Vitals:   05/07/13 0700  BP: 117/77  Pulse: 92  Temp:   Resp:      Lungs/Cor:  NAD Abdomen:  soft, gravid Ex:  no cords, erythema SVE:  3/60/-3 in MAU, now 10/100/+1 FHTs:  140, good STV, NST R Toco:  q 2   A/P   Admit to L&D with SROM  GBS Pos with anaphylaxis to PCN, clinda resistant.  Vancomycin administered, however staff had difficulty obtaining vascular access and vanco administration was delayed by approx 3 hrs.  In that time, pt had epidural placed and became complete.  I personally discussed with her GBS prophylaxis and infection.  She is currently comfortable and feeling  some pressure, but would like to continue to "labor down" as she is not feeling strong urge to push.  Current FHT are Cat 1.   Other routine care.  Philip AspenALLAHAN, Imane Burrough

## 2013-05-07 NOTE — MAU Note (Signed)
Pt reports leaking of fluid @ 0130

## 2013-05-08 LAB — CBC
HCT: 25.5 % — ABNORMAL LOW (ref 36.0–46.0)
Hemoglobin: 8.8 g/dL — ABNORMAL LOW (ref 12.0–15.0)
MCH: 30.4 pg (ref 26.0–34.0)
MCHC: 34.5 g/dL (ref 30.0–36.0)
MCV: 88.2 fL (ref 78.0–100.0)
Platelets: 199 10*3/uL (ref 150–400)
RBC: 2.89 MIL/uL — AB (ref 3.87–5.11)
RDW: 13.1 % (ref 11.5–15.5)
WBC: 5.5 10*3/uL (ref 4.0–10.5)

## 2013-05-08 NOTE — Discharge Summary (Signed)
Obstetric Discharge Summary Reason for Admission: onset of labor Prenatal Procedures: none Intrapartum Procedures: spontaneous vaginal delivery Postpartum Procedures: none Complications-Operative and Postpartum: none Hemoglobin  Date Value Range Status  05/07/2013 11.5* 12.0 - 15.0 g/dL Final     HCT  Date Value Range Status  05/07/2013 33.2* 36.0 - 46.0 % Final   Discharge Diagnoses: Term Pregnancy-delivered  Discharge Information: Date: 05/08/2013 Activity: pelvic rest Diet: routine Medications: Ibuprofen Condition: stable Instructions: refer to practice specific booklet Discharge to: home Follow-up Information   Follow up with Cleburne Savini A, MD In 4 weeks.   Specialty:  Obstetrics and Gynecology   Contact information:   [address] [place] [id number] [phone]       Newborn Data: Live born female  Birth Weight: 7 lb 13.4 oz (3555 g) APGAR: 8, 9  Home with mother.  Linda Kirk 05/08/2013, 6:49 AM

## 2013-05-08 NOTE — Progress Notes (Signed)
Patient is eating, ambulating, voiding.  Pain control is good.  Filed Vitals:   05/07/13 1130 05/07/13 1225 05/07/13 1650 05/07/13 2145  BP: 126/51 123/81 124/83 120/80  Pulse: 86 87 79 98  Temp: 98.2 F (36.8 C) 97.7 F (36.5 C) 98.3 F (36.8 C) 97.9 F (36.6 C)  TempSrc: Oral Oral Oral Oral  Resp: 18 20 18 18   Height:      Weight:      SpO2:        Fundus firm Perineum without swelling.  Lab Results  Component Value Date   WBC 8.1 05/07/2013   HGB 11.5* 05/07/2013   HCT 33.2* 05/07/2013   MCV 86.9 05/07/2013   PLT 266 05/07/2013    B/Positive/-- (10/24 0000)/RI  A/P Post partum day 1.  Routine care.  Expect d/c routine.    Linda Kirk A

## 2013-05-09 NOTE — Progress Notes (Signed)
Patient is eating, ambulating, voiding.  Pain control is good.  Filed Vitals:   05/07/13 2145 05/08/13 0607 05/08/13 1715 05/09/13 0528  BP: 120/80 114/68 115/79 111/70  Pulse: 98 76 78 64  Temp: 97.9 F (36.6 C) 97.1 F (36.2 C) 97.8 F (36.6 C) 98.1 F (36.7 C)  TempSrc: Oral Oral Oral Oral  Resp: 18 18 18 18   Height:      Weight:      SpO2:        Fundus firm Perineum without swelling.  Lab Results  Component Value Date   WBC 5.5 05/08/2013   HGB 8.8* 05/08/2013   HCT 25.5* 05/08/2013   MCV 88.2 05/08/2013   PLT 199 05/08/2013    B/Positive/-- (10/24 0000)/RI  A/P Post partum day 2.  Routine care.  Expect d/c today.    Patrcia Schnepp A

## 2014-02-28 ENCOUNTER — Encounter (HOSPITAL_COMMUNITY): Payer: Self-pay | Admitting: *Deleted

## 2016-08-04 ENCOUNTER — Encounter (HOSPITAL_COMMUNITY): Payer: Self-pay

## 2016-08-04 ENCOUNTER — Emergency Department (HOSPITAL_COMMUNITY)
Admission: EM | Admit: 2016-08-04 | Discharge: 2016-08-05 | Disposition: A | Discharge status: Home / Self Care | Payer: 59 | Attending: Emergency Medicine | Admitting: Emergency Medicine

## 2016-08-04 DIAGNOSIS — J45909 Unspecified asthma, uncomplicated: Secondary | ICD-10-CM | POA: Insufficient documentation

## 2016-08-04 DIAGNOSIS — G43909 Migraine, unspecified, not intractable, without status migrainosus: Secondary | ICD-10-CM | POA: Insufficient documentation

## 2016-08-04 DIAGNOSIS — Z79899 Other long term (current) drug therapy: Secondary | ICD-10-CM | POA: Diagnosis not present

## 2016-08-04 HISTORY — DX: Migraine, unspecified, not intractable, without status migrainosus: G43.909

## 2016-08-04 LAB — POC URINE PREG, ED: Preg Test, Ur: NEGATIVE

## 2016-08-04 MED ORDER — SODIUM CHLORIDE 0.9 % IV BOLUS (SEPSIS)
1000.0000 mL | Freq: Once | INTRAVENOUS | Status: AC
  Administered 2016-08-04: 1000 mL via INTRAVENOUS

## 2016-08-04 MED ORDER — METOCLOPRAMIDE HCL 5 MG/ML IJ SOLN
10.0000 mg | Freq: Once | INTRAMUSCULAR | Status: AC
  Administered 2016-08-04: 10 mg via INTRAVENOUS
  Filled 2016-08-04: qty 2

## 2016-08-04 MED ORDER — DIPHENHYDRAMINE HCL 50 MG/ML IJ SOLN
25.0000 mg | Freq: Once | INTRAMUSCULAR | Status: AC
  Administered 2016-08-04: 25 mg via INTRAVENOUS
  Filled 2016-08-04: qty 1

## 2016-08-04 MED ORDER — DEXAMETHASONE SODIUM PHOSPHATE 10 MG/ML IJ SOLN
10.0000 mg | Freq: Once | INTRAMUSCULAR | Status: AC
  Administered 2016-08-04: 10 mg via INTRAVENOUS
  Filled 2016-08-04: qty 1

## 2016-08-04 MED ORDER — ONDANSETRON 4 MG PO TBDP
4.0000 mg | ORAL_TABLET | Freq: Once | ORAL | Status: AC | PRN
  Administered 2016-08-04: 4 mg via ORAL
  Filled 2016-08-04: qty 1

## 2016-08-04 MED ORDER — KETOROLAC TROMETHAMINE 30 MG/ML IJ SOLN
30.0000 mg | Freq: Once | INTRAMUSCULAR | Status: AC
  Administered 2016-08-04: 30 mg via INTRAVENOUS
  Filled 2016-08-04: qty 1

## 2016-08-04 NOTE — ED Provider Notes (Signed)
[facility]-EMERGENCY DEPT Provider Note   CSN: [id number] Arrival date & time: 08/04/16  1906   By signing my name below, I, Clarisse Gouge, attest that this documentation has been prepared under the direction and in the presence of [facility], PA-C. Electronically Signed: Clarisse Gouge, Scribe. 08/04/16. 11:38 PM.   History   Chief Complaint Chief Complaint  Patient presents with  . Migraine   The history is provided by the patient and medical records. No language interpreter was used.    HPI Comments: Linda Kirk is a 31 y.o. female with Hx of asthma, anemia and migraines who presents to the Emergency Department complaining of R sided headache since yesterday. Pt notes her current symptoms feel like past migraines. Pt describes constant, shooting, 10/10 R sided pain radiating to the R eye that is improved with low light and rest, and worsened with light and strong smells. She notes associated nausea, vomiting and photophobia. She states her last migraine was 2 weeks ago, she has them multiple weeks apart and they last days at at time. Pt notes she previously prescibed maxol normally alleviated her headaches, but she has not been able to get any because she just recently regained insurance and she has no more from past prescriptions. Pt given zofran in [facility] ED prior to evaluation with relief to nausea/ vomiting. No neurologist noted. She states she is on birth control. Pt denies gait change, suspicion of pregnancy.  Past Medical History:  Diagnosis Date  . Anemia   . Asthma   . Migraine     Patient Active Problem List   Diagnosis Date Noted  . Indication for care in labor or delivery 05/07/2013  . Postpartum state 05/07/2013    Past Surgical History:  Procedure Laterality Date  . pediatric surgery    . pyloric stenosis      OB History    Gravida Para Term Preterm AB Living   0 2 3   SAB TAB Ectopic Multiple Live Births   2 0 0 0 1       Home Medications     Prior to Admission medications   Medication Sig Start Date End Date Taking? Authorizing Provider  ibuprofen (ADVIL,MOTRIN) 200 MG tablet Take 400 mg by mouth every 6 (six) hours as needed for headache, mild pain or moderate pain.   Yes Historical Provider, MD    Family History History reviewed. No pertinent family history.  Social History Social History  Substance Use Topics  . Smoking status: Never Smoker  . Smokeless tobacco: Never Used  . Alcohol use No     Allergies   Penicillins   Review of Systems Review of Systems  Eyes: Positive for photophobia.  Gastrointestinal: Positive for nausea and vomiting.  Musculoskeletal: Negative for gait problem.  Neurological: Positive for headaches.  All other systems reviewed and are negative.    Physical Exam Updated Vital Signs BP 118/79 (BP Location: Left Arm)   Pulse 82   Temp 98.8 F (37.1 C) (Oral)   Resp 18   Ht  (1.651 m)   Wt 180 lb (81.6 kg)   LMP 07/21/2016   SpO2 100%   BMI 29.95 kg/m   Physical Exam  Constitutional: She is oriented to person, place, and time. She appears well-developed and well-nourished. No distress.  HENT:  Head: Normocephalic and atraumatic.  Mouth/Throat: Oropharynx is clear and moist.  Eyes: Conjunctivae and EOM are normal. Pupils are equal, round, and reactive to light.  No scleral icterus.  No horizontal, vertical or rotational nystagmus  Neck: Normal range of motion. Neck supple.  Full active and passive ROM without pain No midline or paraspinal tenderness No nuchal rigidity or meningeal signs  Cardiovascular: Normal rate, regular rhythm and intact distal pulses.   Pulmonary/Chest: Effort normal and breath sounds normal. No respiratory distress. She has no wheezes. She has no rales.  Abdominal: Soft. Bowel sounds are normal. There is no tenderness. There is no rebound and no guarding.  Musculoskeletal: Normal range of motion.  Lymphadenopathy:    She has no cervical  adenopathy.  Neurological: She is alert and oriented to person, place, and time. No cranial nerve deficit. She exhibits normal muscle tone. Coordination normal.  Mental Status:  Alert, oriented, thought content appropriate. Speech fluent without evidence of aphasia. Able to follow 2 step commands without difficulty.  Cranial Nerves:  II:  Peripheral visual fields grossly normal, pupils equal, round, reactive to light III,IV, VI: ptosis not present, extra-ocular motions intact bilaterally  V,VII: smile symmetric, facial light touch sensation equal VIII: hearing grossly normal bilaterally  IX,X: midline uvula rise  XI: bilateral shoulder shrug equal and strong XII: midline tongue extension  Motor:  5/5 in upper and lower extremities bilaterally including strong and equal grip strength and dorsiflexion/plantar flexion Sensory: Pinprick and light touch normal in all extremities.  Cerebellar: normal finger-to-nose with bilateral upper extremities Gait: normal gait and balance CV: distal pulses palpable throughout   Skin: Skin is warm and dry. No rash noted. She is not diaphoretic.  Psychiatric: She has a normal mood and affect. Her behavior is normal. Judgment and thought content normal.  Nursing note and vitals reviewed.    ED Treatments / Results  DIAGNOSTIC STUDIES: Oxygen Saturation is 100% on RA, normal by my interpretation.    COORDINATION OF CARE: 10:56 PM Discussed treatment plan with pt at bedside and pt agreed to plan.  Labs (all labs ordered are listed, but only abnormal results are displayed) Labs Reviewed  POC URINE PREG, ED    Procedures Procedures (including critical care time)  Medications Ordered in ED Medications  ondansetron (ZOFRAN-ODT) disintegrating tablet 4 mg (4 mg Oral Given 08/04/16 1918)  sodium chloride 0.9 % bolus 1,000 mL (0 mLs Intravenous Stopped 08/04/16 2345)  metoCLOPramide (REGLAN) injection 10 mg (10 mg Intravenous Given 08/04/16 2325)    diphenhydrAMINE (BENADRYL) injection 25 mg (25 mg Intravenous Given 08/04/16 2320)  dexamethasone (DECADRON) injection 10 mg (10 mg Intravenous Given 08/04/16 2330)  ketorolac (TORADOL) 30 MG/ML injection 30 mg (30 mg Intravenous Given 08/04/16 2315)     Initial Impression / Assessment and Plan / ED Course  I have reviewed the triage vital signs and the nursing notes.  Pertinent labs & imaging results that were available during my care of the patient were reviewed by me and considered in my medical decision making (see chart for details).     Pt HA treated and improved while in ED.  Presentation is like pts typical HA and non concerning for [facility], ICH, Meningitis, or temporal arteritis. Pt is afebrile with no focal neuro deficits, nuchal rigidity, or change in vision. Pt is to follow up with PCP to discuss prophylactic medication. Pt verbalizes understanding and is agreeable with plan to dc.    Final Clinical Impressions(s) / ED Diagnoses   Final diagnoses:  Migraine without status migrainosus, not intractable, unspecified migraine type    New Prescriptions Current Discharge Medication List  I personally performed the services described in this documentation, which was scribed in my presence. The recorded information has been reviewed and is accurate.    Dahlia Client Makila Colombe, PA-C 08/05/16 0133    Arby Barrette, MD 08/12/16 0900

## 2016-08-04 NOTE — ED Triage Notes (Signed)
PT C/O A MIGRAINE SINCE Saturday WITH N/V AND SOUND SENSITIVITY. PT STS SHE HAS TRIED TO TAKE IBUPROFEN, BUT CAN  NOT KEEP IT DOWN.

## 2016-08-04 NOTE — ED Notes (Signed)
ED Provider at bedside. 

## 2016-08-04 NOTE — ED Notes (Addendum)
Patient states she has a history of migraines. She has been taking ibuprofen for it over the past 1-2 days and it doesn't seem to be helping. She was on imitrex in the past but just recently got insurance and can now have a f/u with her doctor. States she has an appointment but it's almost 2 months away. In the past it has always been on the right side just as today. C/O of light irritation and some nausea/vomiting with it which is her baseline.

## 2016-08-04 NOTE — ED Notes (Signed)
Pending Physician sign-up

## 2016-08-05 NOTE — Discharge Instructions (Signed)
1. Medications: usual home medications °2. Treatment: rest, drink plenty of fluids, if headache persists take 800mmg ibuprofen with caffeine °3. Follow Up: Please followup with your primary doctor in 3 days and neurology within 1 week for discussion of your diagnoses and further evaluation after today's visit; if you do not have a primary care doctor use the resource guide provided to find one; Please return to the ER for double vision, speech difficulty, gait disturbance, persistent vomiting or other concerns.  °

## 2016-08-07 ENCOUNTER — Ambulatory Visit (INDEPENDENT_AMBULATORY_CARE_PROVIDER_SITE_OTHER): Payer: 59 | Admitting: Neurology

## 2016-08-07 ENCOUNTER — Encounter: Payer: Self-pay | Admitting: Neurology

## 2016-08-07 DIAGNOSIS — G43019 Migraine without aura, intractable, without status migrainosus: Secondary | ICD-10-CM | POA: Insufficient documentation

## 2016-08-07 HISTORY — DX: Migraine without aura, intractable, without status migrainosus: G43.019

## 2016-08-07 MED ORDER — PROMETHAZINE HCL 25 MG RE SUPP: 25.0000 mg | Freq: Four times a day (QID) | RECTAL | 3 refills | Status: DC | PRN

## 2016-08-07 MED ORDER — SUMATRIPTAN 20 MG/ACT NA SOLN: 20.0000 mg | Freq: Two times a day (BID) | NASAL | 3 refills | Status: DC | PRN

## 2016-08-07 MED ORDER — ZONISAMIDE 25 MG PO CAPS: ORAL_CAPSULE | ORAL | 3 refills | Status: DC

## 2016-08-07 NOTE — Patient Instructions (Signed)
   With the zonegran 25 mg capsule, begin one twice a day for 2 weeks, then take 2 twice a day.

## 2016-08-07 NOTE — Progress Notes (Signed)
Reason for visit: Migraine headache  Referring physician: [place]  Linda Kirk is a 31 y.o. female  History of present illness:  Linda Kirk is a 31 year old right-handed white female with a history of migraine headaches since she was 31 years old. The patient indicates that her headaches have gradually become worse as she has gotten older. She visited the emergency room on 08/04/2016 with a severe headache. Her headaches may sometimes wake her up in the middle of the night, she may have significant nausea and vomiting, she tries to take Motrin for the headache, but she cannot keep the medication down secondary to her vomiting. The patient does have photophobia without phonophobia with the headache. Odors will bother her during the headache. The headaches may last anywhere from 1-3 days at a time, she will have 7 or 8 headache days a month. The patient indicates that the headaches always start around the right eye and then go backwards into the back of the head. She may have some blurring of vision with the right eye, she denies any numbness or weakness of the face, arms, or legs. There may be some cognitive slowing during the headache. In the past she has taken Maxalt with some benefit. She has never been on a daily prophylactic medications for the headache. She indicates that her mother and her brother also have migraine. She is sent to this office for an evaluation. She denies any known activators for her headache.  Past Medical History:  Diagnosis Date  . Anemia   . Asthma   . Common migraine with intractable migraine 08/07/2016  . Migraine     Past Surgical History:  Procedure Laterality Date  . pediatric surgery    . pyloric stenosis      Family History  Problem Relation Age of Onset  . Headache Mother   . Depression Mother   . Migraines Mother   . High blood pressure Father   . Hypertension Father   . Migraines Brother     Social history:  reports that she has never  smoked. She has never used smokeless tobacco. She reports that she does not drink alcohol or use drugs.  Medications:  Prior to Admission medications   Medication Sig Start Date End Date Taking? Authorizing Provider  ibuprofen (ADVIL,MOTRIN) 200 MG tablet Take 400 mg by mouth every 6 (six) hours as needed for headache, mild pain or moderate pain.   Yes Historical Provider, MD  LO LOESTRIN FE 1 MG-10 MCG / 10 MCG tablet Take 1 tablet by mouth daily. 05/23/16  Yes Historical Provider, MD  promethazine (PHENERGAN) 25 MG suppository Place 1 suppository (25 mg total) rectally every 6 (six) hours as needed for nausea or vomiting. 08/07/16   Linda Spaniel, MD  SUMAtriptan Willette Brace) 20 MG/ACT nasal spray Place 1 spray (20 mg total) into the nose 2 (two) times daily as needed for migraine or headache. 08/07/16   Linda Spaniel, MD  zonisamide (ZONEGRAN) 25 MG capsule 2 capsules twice a day 08/07/16   Linda Spaniel, MD      Allergies  Allergen Reactions  . Penicillins Anaphylaxis    Has patient had a PCN reaction causing immediate rash, facial/tongue/throat swelling, SOB or lightheadedness with hypotension: yes  Has patient had a PCN reaction causing severe rash involving mucus membranes or skin necrosis: no  Has patient had a PCN reaction that required hospitalization: yes Has patient had a PCN reaction occurring within the last 10  years: no If all of the above answers are "NO", then may proceed with Cephalosporin use.     ROS:  Out of a complete 14 system review of symptoms, the patient complains only of the following symptoms, and all other reviewed systems are negative.  Headache Restless legs  Blood pressure 139/82, pulse 80, height  (1.651 m), weight 185 lb 8 oz (84.1 kg), last menstrual period 07/21/2016, unknown if currently breastfeeding.  Physical Exam  General: The patient is alert and cooperative at the time of the examination. The patient is minimally obese.  Eyes:  Pupils are equal, round, and reactive to light. Discs are flat bilaterally.  Neck: The neck is supple, no carotid bruits are noted.  Respiratory: The respiratory examination is clear.  Cardiovascular: The cardiovascular examination reveals a regular rate and rhythm, no obvious murmurs or rubs are noted.  Skin: Extremities are without significant edema.  Neurologic Exam  Mental status: The patient is alert and oriented x 3 at the time of the examination. The patient has apparent normal recent and remote memory, with an apparently normal attention span and concentration ability.  Cranial nerves: Facial symmetry is present. There is good sensation of the face to pinprick and soft touch bilaterally. The strength of the facial muscles and the muscles to head turning and shoulder shrug are normal bilaterally. Speech is well enunciated, no aphasia or dysarthria is noted. Extraocular movements are full. Visual fields are full. The tongue is midline, and the patient has symmetric elevation of the soft palate. No obvious hearing deficits are noted.  Motor: The motor testing reveals 5 over 5 strength of all 4 extremities. Good symmetric motor tone is noted throughout.  Sensory: Sensory testing is intact to pinprick, soft touch, vibration sensation, and position sense on all 4 extremities. No evidence of extinction is noted.  Coordination: Cerebellar testing reveals good finger-nose-finger and heel-to-shin bilaterally.  Gait and station: Gait is normal. Tandem gait is normal. Romberg is negative. No drift is seen.  Reflexes: Deep tendon reflexes are symmetric and normal bilaterally. Toes are downgoing bilaterally.   Assessment/Plan:  1. Intractable common migraine  The patient is having prolonged headaches associated with nausea and vomiting, she is unable to keep medications down. The patient will be given a trial on Zonegran, and she will be given Imitrex nasal spray for the headache. The  patient will take Phenergan suppositories if needed for nausea. She will follow-up through this office in 3 months. She is to call for any dose adjustments of the Zonegran.  Marlan Palau MD 08/07/2016 3:34 PM  [facility] [address] [place], [place] [address]  Phone [phone] Fax [phone]

## 2016-09-30 ENCOUNTER — Telehealth: Payer: Self-pay | Admitting: Neurology

## 2016-09-30 NOTE — Telephone Encounter (Signed)
Called and spoke with patient. She has had headache since yesterday. She tried ibuprofen OTC 200mg  (4-5 tablet per dose), Sumatriptan nasal spray x2 (relieved for awhile, headache did not go away completely), phenergan yesterday which helped some. Not as nauseous today.  She is taking Zonegran 2 tablets twice daily as prescribed.   She was able to lay down this morning but she woke back up with headache. Pain located on left side of head, behind eye. Cannot keep food/drink down. She is wondering what other options she has. Advised I will send message to CW,MD to advise. We will call her back.

## 2016-09-30 NOTE — Telephone Encounter (Signed)
I called back a third time, left a message. The patient is to contact our office again if she still has a headache and needs assistance.

## 2016-09-30 NOTE — Telephone Encounter (Signed)
Patient called office in reference to having a headache beginning around 7-8am on Sunday.  Patient has been taking all medication prescribed with no relief.  Please call

## 2016-09-30 NOTE — Telephone Encounter (Signed)
I called the patient again, left a message, I will call back later. 

## 2016-11-12 ENCOUNTER — Encounter: Payer: Self-pay | Admitting: Adult Health

## 2016-11-12 ENCOUNTER — Ambulatory Visit (INDEPENDENT_AMBULATORY_CARE_PROVIDER_SITE_OTHER): Payer: 59 | Admitting: Adult Health

## 2016-11-12 VITALS — BP 110/74 | HR 76 | Wt 184.0 lb

## 2016-11-12 DIAGNOSIS — G43009 Migraine without aura, not intractable, without status migrainosus: Secondary | ICD-10-CM | POA: Diagnosis not present

## 2016-11-12 MED ORDER — ZONISAMIDE 25 MG PO CAPS: 75.0000 mg | ORAL_CAPSULE | Freq: Two times a day (BID) | ORAL | 5 refills | Status: DC

## 2016-11-12 NOTE — Progress Notes (Signed)
PATIENT: Linda Kirk DOB: [birth year]  REASON FOR VISIT: follow up- migraine headaches HISTORY FROM: patient  HISTORY OF PRESENT ILLNESS: Linda Kirk is a 31 year old female with a history of migraine headaches. She returns today for follow-up. She reports that Zonegran has helped with the frequency of her headaches. She has approximately 2 headaches a month however they last 2-3 days. Her headaches always occur behind the right eye and radiating to the right side of the head. She does have photophobia but denies phonophobia. She also has nausea and vomiting. She states that if she is able to take the Imitrex nasal spray her headache typically resolves fairly quickly however she states most the time she waits till she gets home and takes the medication and goes right to sleep. She is concerned if she tries to take this during the day he will make her too sleepy to function at work. She returns today for an evaluation.  HISTORY 08/07/16:  Linda Kirk is a 31 year old right-handed white female with a history of migraine headaches since she was 31 years old. The patient indicates that her headaches have gradually become worse as she has gotten older. She visited the emergency room on 08/04/2016 with a severe headache. Her headaches may sometimes wake her up in the middle of the night, she may have significant nausea and vomiting, she tries to take Motrin for the headache, but she cannot keep the medication down secondary to her vomiting. The patient does have photophobia without phonophobia with the headache. Odors will bother her during the headache. The headaches may last anywhere from 1-3 days at a time, she will have 7 or 8 headache days a month. The patient indicates that the headaches always start around the right eye and then go backwards into the back of the head. She may have some blurring of vision with the right eye, she denies any numbness or weakness of the face, arms, or legs. There may be  some cognitive slowing during the headache. In the past she has taken Maxalt with some benefit. She has never been on a daily prophylactic medications for the headache. She indicates that her mother and her brother also have migraine. She is sent to this office for an evaluation. She denies any known activators for her headache.  REVIEW OF SYSTEMS: Out of a complete 14 system review of symptoms, the patient complains only of the following symptoms, and all other reviewed systems are negative. See HPI   ALLERGIES: Allergies  Allergen Reactions  . Penicillins Anaphylaxis    Has patient had a PCN reaction causing immediate rash, facial/tongue/throat swelling, SOB or lightheadedness with hypotension: yes  Has patient had a PCN reaction causing severe rash involving mucus membranes or skin necrosis: no  Has patient had a PCN reaction that required hospitalization: yes Has patient had a PCN reaction occurring within the last 10 years: no If all of the above answers are "NO", then may proceed with Cephalosporin use.     HOME MEDICATIONS: Outpatient Medications Prior to Visit  Medication Sig Dispense Refill  . ibuprofen (ADVIL,MOTRIN) 200 MG tablet Take 400 mg by mouth every 6 (six) hours as needed for headache, mild pain or moderate pain.    . LO LOESTRIN FE 1 MG-10 MCG / 10 MCG tablet Take 1 tablet by mouth daily.    . promethazine (PHENERGAN) 25 MG suppository Place 1 suppository (25 mg total) rectally every 6 (six) hours as needed for nausea or vomiting. 12 each  3  . SUMAtriptan (IMITREX) 20 MG/ACT nasal spray Place 1 spray (20 mg total) into the nose 2 (two) times daily as needed for migraine or headache. 1 Inhaler 3  . zonisamide (ZONEGRAN) 25 MG capsule 2 capsules twice a day 120 capsule 3   No facility-administered medications prior to visit.     PAST MEDICAL HISTORY: Past Medical History:  Diagnosis Date  . Anemia   . Asthma   . Common migraine with intractable migraine 08/07/2016    . Migraine     PAST SURGICAL HISTORY: Past Surgical History:  Procedure Laterality Date  . pediatric surgery    . pyloric stenosis      FAMILY HISTORY: Family History  Problem Relation Age of Onset  . Headache Mother   . Depression Mother   . Migraines Mother   . High blood pressure Father   . Hypertension Father   . Migraines Brother     SOCIAL HISTORY: Social History   Social History  . Marital status: Single    Spouse name: N/A  . Number of children: N/A  . Years of education: N/A   Occupational History  . Not on file.   Social History Main Topics  . Smoking status: Never Smoker  . Smokeless tobacco: Never Used  . Alcohol use No  . Drug use: No  . Sexual activity: Yes   Other Topics Concern  . Not on file   Social History Narrative   Lives with boyfriend Fredderick Erb) and 3 children   Caffeine use: Drinks coffee/tea (1 cup or more of coffee a day)   Right-handed      PHYSICAL EXAM  Vitals:   11/12/16 1519  BP: 110/74  Pulse: 76  Weight: 184 lb (83.5 kg)   Body mass index is 30.62 kg/m.  Generalized: Well developed, in no acute distress   Neurological examination  Mentation: Alert oriented to time, place, history taking. Follows all commands speech and language fluent Cranial nerve II-XII: Pupils were equal round reactive to light. Extraocular movements were full, visual field were full on confrontational test. Facial sensation and strength were normal. Uvula tongue midline. Head turning and shoulder shrug  were normal and symmetric. Motor: The motor testing reveals 5 over 5 strength of all 4 extremities. Good symmetric motor tone is noted throughout.  Sensory: Sensory testing is intact to soft touch on all 4 extremities. No evidence of extinction is noted.  Coordination: Cerebellar testing reveals good finger-nose-finger and heel-to-shin bilaterally.  Gait and station: Gait is normal. Tandem gait is normal. Romberg is negative. No drift is  seen.  Reflexes: Deep tendon reflexes are symmetric and normal bilaterally.   DIAGNOSTIC DATA (LABS, IMAGING, TESTING) - I reviewed patient records, labs, notes, testing and imaging myself where available.    ASSESSMENT AND PLAN 31 y.o. year old female  has a past medical history of Anemia; Asthma; Common migraine with intractable migraine (08/07/2016); and Migraine. here with:  1. Migraine headache  The patient's migraine frequency has improved with sonogram. However when she does get a headache it tends to last 2 or 3 days. We will try increasing Zonegran to 75 mg twice a day. She is unable to tolerate that she will let us know. She is advised that she should use the Imitrex nasal spray at the onset of a headache to get the best relief. She voiced understanding. She states that she will try this however that causes extreme drowsiness she was not billed to take it  when she is at work. She is advised that if her symptoms worsen or she develops new symptoms she should let us know. She will follow-up in 4 months or sooner if needed.  I spent 15 minutes with the patient. 50% of this time was spent discussing medication imitrex     Butch PennyMegan Naasir Carreira, MSN, NP-C 11/12/2016, 4:25 PM [facility] [address], [address] [place], [place] [address] [phone]

## 2016-11-12 NOTE — Progress Notes (Signed)
I have read the note, and I agree with the clinical assessment and plan.  Lakendria Nicastro KEITH   

## 2016-11-12 NOTE — Patient Instructions (Signed)
Your Plan:  Increase zonegran to 75 mg twice a day Try Imitrex nasal spray at the onset of migraine If your symptoms worsen or you develop new symptoms please let us know.    Thank you for coming to see [facility] at [facility]. I hope we have been able to provide you high quality care today.  You may receive a patient satisfaction survey over the next few weeks. We would appreciate your feedback and comments so that we may continue to improve ourselves and the health of our patients.

## 2017-03-18 ENCOUNTER — Encounter: Payer: Self-pay | Admitting: Adult Health

## 2017-03-18 ENCOUNTER — Ambulatory Visit: Payer: 59 | Admitting: Adult Health

## 2017-03-18 ENCOUNTER — Encounter (INDEPENDENT_AMBULATORY_CARE_PROVIDER_SITE_OTHER): Payer: Self-pay

## 2017-03-18 VITALS — BP 109/69 | HR 83 | Wt 186.0 lb

## 2017-03-18 DIAGNOSIS — G43019 Migraine without aura, intractable, without status migrainosus: Secondary | ICD-10-CM | POA: Diagnosis not present

## 2017-03-18 MED ORDER — ONDANSETRON 4 MG PO TBDP: 4.0000 mg | ORAL_TABLET | Freq: Three times a day (TID) | ORAL | 0 refills | Status: DC | PRN

## 2017-03-18 MED ORDER — NORTRIPTYLINE HCL 10 MG PO CAPS: 10.0000 mg | ORAL_CAPSULE | Freq: Every day | ORAL | 5 refills | Status: DC

## 2017-03-18 NOTE — Patient Instructions (Signed)
Your Plan:  Continue zonegran and Imitrex Start nortriptyline 10 mg at bedtime for migraine prevention Start Zofran 4 mg every 8 hours if needed for nausea If your symptoms worsen or you develop new symptoms please let us know.    Thank you for coming to [facility] at [facility]. I hope we have been able to provide you high quality care today.  You may receive a patient satisfaction survey over the next few weeks. We would appreciate your feedback and comments so that we may continue to improve ourselves and the health of our patients.  Nortriptyline capsules What is this medicine? NORTRIPTYLINE (nor TRIP ti leen) is used to treat depression. This medicine may be used for other purposes; ask your health care provider or pharmacist if you have questions. COMMON BRAND NAME(S): Aventyl, Pamelor What should I tell my health care provider before I take this medicine? They need to know if you have any of these conditions: -an alcohol problem -bipolar disorder or schizophrenia -difficulty passing urine, prostate trouble -glaucoma -heart disease or recent heart attack -liver disease -over active thyroid -seizures -thoughts or plans of suicide or a previous suicide attempt or family history of suicide attempt -an unusual or allergic reaction to nortriptyline, other medicines, foods, dyes, or preservatives -pregnant or trying to get pregnant -breast-feeding How should I use this medicine? Take this medicine by mouth with a glass of water. Follow the directions on the prescription label. Take your doses at regular intervals. Do not take it more often than directed. Do not stop taking this medicine suddenly except upon the advice of your doctor. Stopping this medicine too quickly may cause serious side effects or your condition may worsen. A special MedGuide will be given to you by the pharmacist with each prescription and refill. Be sure to read this information carefully each  time. Talk to your pediatrician regarding the use of this medicine in children. Special care may be needed. Overdosage: If you think you have taken too much of this medicine contact a poison control center or emergency room at once. NOTE: This medicine is only for you. Do not share this medicine with others. What if I miss a dose? If you miss a dose, take it as soon as you can. If it is almost time for your next dose, take only that dose. Do not take double or extra doses. What may interact with this medicine? Do not take this medicine with any of the following medications: -arsenic trioxide -certain medicines medicines for irregular heart beat -cisapride -halofantrine -linezolid -MAOIs like Carbex, Eldepryl, Marplan, Nardil, and Parnate -methylene blue (injected into a vein) -other medicines for mental depression -phenothiazines like perphenazine, thioridazine and chlorpromazine -pimozide -probucol -procarbazine -sparfloxacin -St. John's Wort -ziprasidone This medicine may also interact with any of the following medications: -atropine and related drugs like hyoscyamine, scopolamine, tolterodine and others -barbiturate medicines for inducing sleep or treating seizures, such as phenobarbital -cimetidine -medicines for diabetes -medicines for seizures like carbamazepine or phenytoin -reserpine -thyroid medicine This list may not describe all possible interactions. Give your health care provider a list of all the medicines, herbs, non-prescription drugs, or dietary supplements you use. Also tell them if you smoke, drink alcohol, or use illegal drugs. Some items may interact with your medicine. What should I watch for while using this medicine? Tell your doctor if your symptoms do not get better or if they get worse. Visit your doctor or health care professional for regular checks on your  progress. Because it may take several weeks to see the full effects of this medicine, it is  important to continue your treatment as prescribed by your doctor. Patients and their families should watch out for new or worsening thoughts of suicide or depression. Also watch out for sudden changes in feelings such as feeling anxious, agitated, panicky, irritable, hostile, aggressive, impulsive, severely restless, overly excited and hyperactive, or not being able to sleep. If this happens, especially at the beginning of treatment or after a change in dose, call your health care professional. Bonita QuinYou may get drowsy or dizzy. Do not drive, use machinery, or do anything that needs mental alertness until you know how this medicine affects you. Do not stand or sit up quickly, especially if you are an older patient. This reduces the risk of dizzy or fainting spells. Alcohol may interfere with the effect of this medicine. Avoid alcoholic drinks. Do not treat yourself for coughs, colds, or allergies without asking your doctor or health care professional for advice. Some ingredients can increase possible side effects. Your mouth may get dry. Chewing sugarless gum or sucking hard candy, and drinking plenty of water may help. Contact your doctor if the problem does not go away or is severe. This medicine may cause dry eyes and blurred vision. If you wear contact lenses you may feel some discomfort. Lubricating drops may help. See your eye doctor if the problem does not go away or is severe. This medicine can cause constipation. Try to have a bowel movement at least every 2 to 3 days. If you do not have a bowel movement for 3 days, call your doctor or health care professional. This medicine can make you more sensitive to the sun. Keep out of the sun. If you cannot avoid being in the sun, wear protective clothing and use sunscreen. Do not use sun lamps or tanning beds/booths. What side effects may I notice from receiving this medicine? Side effects that you should report to your doctor or health care professional as  soon as possible: -allergic reactions like skin rash, itching or hives, swelling of the face, lips, or tongue -anxious -breathing problems -changes in vision -confusion -elevated mood, decreased need for sleep, racing thoughts, impulsive behavior -eye pain -fast, irregular heartbeat -feeling faint or lightheaded, falls -feeling agitated, angry, or irritable -fever with increased sweating -hallucination, loss of contact with reality -seizures -stiff muscles -suicidal thoughts or other mood changes -tingling, pain, or numbness in the feet or hands -trouble passing urine or change in the amount of urine -trouble sleeping -unusually weak or tired -vomiting -yellowing of the eyes or skin Side effects that usually do not require medical attention (report to your doctor or health care professional if they continue or are bothersome): -change in sex drive or performance -change in appetite or weight -constipation -dizziness -dry mouth -nausea -tired -tremors -upset stomach This list may not describe all possible side effects. Call your doctor for medical advice about side effects. You may report side effects to FDA at 1-800-FDA-1088. Where should I keep my medicine? Keep out of the reach of children. Store at room temperature between 15 and 30 degrees C (59 and 86 degrees F). Keep container tightly closed. Throw away any unused medicine after the expiration date. NOTE: This sheet is a summary. It may not cover all possible information. If you have questions about this medicine, talk to your doctor, pharmacist, or health care provider.  2018 Elsevier/Gold Standard (2015-09-15 12:53:08)

## 2017-03-18 NOTE — Progress Notes (Signed)
PATIENT: Linda Kirk DOB: Apr 12, 1986  REASON FOR VISIT: follow up HISTORY FROM: patient  HISTORY OF PRESENT ILLNESS: Today 03/18/17 Linda Kirk is a 31 year old female with a history of migraine headaches.  She returns today for follow-up.  At the last visit we increased Zonegran however she does not feel that is offered her any additional benefit.  She states that she has been taking Imitrex as soon as the headache starts.  She states most of the time this will resolve her headache fairly quickly but there are other times that she has no response. she reports that she has been on Maxalt in the past but she has not been on any other daily preventative medication.  She reports that her headaches typically start around the right eye.  She states with severe headache she may have some visual loss but reports that this has not occurred recently.  She does have photophobia but denies phonophobia.  She does report significant nausea but typically no vomiting she denies any weakness in the extremities.  She returns today for evaluation peer  HISTORY 11/12/16: Linda Kirk is a 31 year old female with a history of migraine headaches. She returns today for follow-up. She reports that Zonegran has helped with the frequency of her headaches. She has approximately 2 headaches a month however they last 2-3 days. Her headaches always occur behind the right eye and radiating to the right side of the head. She does have photophobia but denies phonophobia. She also has nausea and vomiting. She states that if she is able to take the Imitrex nasal spray her headache typically resolves fairly quickly however she states most the time she waits till she gets home and takes the medication and goes right to sleep. She is concerned if she tries to take this during the day he will make her too sleepy to function at work. She returns today for an evaluation.  REVIEW OF SYSTEMS: Out of a complete 14 system review of symptoms, the  patient complains only of the following symptoms, and all other reviewed systems are negative.  See HPI  ALLERGIES: Allergies  Allergen Reactions  . Penicillins Anaphylaxis    Has patient had a PCN reaction causing immediate rash, facial/tongue/throat swelling, SOB or lightheadedness with hypotension: yes  Has patient had a PCN reaction causing severe rash involving mucus membranes or skin necrosis: no  Has patient had a PCN reaction that required hospitalization: yes Has patient had a PCN reaction occurring within the last 10 years: no If all of the above answers are "NO", then may proceed with Cephalosporin use.     HOME MEDICATIONS: Outpatient Medications Prior to Visit  Medication Sig Dispense Refill  . ibuprofen (ADVIL,MOTRIN) 200 MG tablet Take 400 mg by mouth every 6 (six) hours as needed for headache, mild pain or moderate pain.    . LO LOESTRIN FE 1 MG-10 MCG / 10 MCG tablet Take 1 tablet by mouth daily.    . promethazine (PHENERGAN) 25 MG suppository Place 1 suppository (25 mg total) rectally every 6 (six) hours as needed for nausea or vomiting. 12 each 3  . SUMAtriptan (IMITREX) 20 MG/ACT nasal spray Place 1 spray (20 mg total) into the nose 2 (two) times daily as needed for migraine or headache. 1 Inhaler 3  . zonisamide (ZONEGRAN) 25 MG capsule Take 3 capsules (75 mg total) by mouth 2 (two) times daily. 180 capsule 5   No facility-administered medications prior to visit.  PAST MEDICAL HISTORY: Past Medical History:  Diagnosis Date  . Anemia   . Asthma   . Common migraine with intractable migraine 08/07/2016  . Migraine     PAST SURGICAL HISTORY: Past Surgical History:  Procedure Laterality Date  . pediatric surgery    . pyloric stenosis      FAMILY HISTORY: Family History  Problem Relation Age of Onset  . Headache Mother   . Depression Mother   . Migraines Mother   . High blood pressure Father   . Hypertension Father   . Migraines Brother      SOCIAL HISTORY: Social History   Socioeconomic History  . Marital status: Single    Spouse name: Not on file  . Number of children: Not on file  . Years of education: Not on file  . Highest education level: Not on file  Social Needs  . Financial resource strain: Not on file  . Food insecurity - worry: Not on file  . Food insecurity - inability: Not on file  . Transportation needs - medical: Not on file  . Transportation needs - non-medical: Not on file  Occupational History  . Not on file  Tobacco Use  . Smoking status: Never Smoker  . Smokeless tobacco: Never Used  Substance and Sexual Activity  . Alcohol use: No  . Drug use: No  . Sexual activity: Yes  Other Topics Concern  . Not on file  Social History Narrative   Lives with boyfriend Fredderick Erb) and 3 children   Caffeine use: Drinks coffee/tea (1 cup or more of coffee a day)   Right-handed      PHYSICAL EXAM  Vitals:   03/18/17 0824  BP: 109/69  Pulse: 83  Weight: 186 lb (84.4 kg)   Body mass index is 30.95 kg/m.  Generalized: Well developed, in no acute distress   Neurological examination  Mentation: Alert oriented to time, place, history taking. Follows all commands speech and language fluent Cranial nerve II-XII: Pupils were equal round reactive to light. Extraocular movements were full, visual field were full on confrontational test. Facial sensation and strength were normal. Uvula tongue midline. Head turning and shoulder shrug  were normal and symmetric. Motor: The motor testing reveals 5 over 5 strength of all 4 extremities. Good symmetric motor tone is noted throughout.  Sensory: Sensory testing is intact to soft touch on all 4 extremities. No evidence of extinction is noted.  Coordination: Cerebellar testing reveals good finger-nose-finger and heel-to-shin bilaterally.  Gait and station: Gait is normal. Tandem gait is normal. Romberg is negative. No drift is seen.  Reflexes: Deep tendon  reflexes are symmetric and normal bilaterally.   DIAGNOSTIC DATA (LABS, IMAGING, TESTING) - I reviewed patient records, labs, notes, testing and imaging myself where available.  Lab Results  Component Value Date   WBC 5.5 05/08/2013   HGB 8.8 (L) 05/08/2013   HCT 25.5 (L) 05/08/2013   MCV 88.2 05/08/2013   PLT 199 05/08/2013      Component Value Date/Time   NA 134 (L) 12/11/2012 1810   K 3.1 (L) 12/11/2012 1810   CL 100 12/11/2012 1810   CO2 24 12/11/2012 1810   GLUCOSE 84 12/11/2012 1810   BUN 5 (L) 12/11/2012 1810   CREATININE 0.53 12/11/2012 1810   CALCIUM 9.3 12/11/2012 1810   PROT 6.8 12/11/2012 1810   ALBUMIN 3.5 12/11/2012 1810   AST 14 12/11/2012 1810   ALT 6 12/11/2012 1810   ALKPHOS 47 12/11/2012 1810  BILITOT 0.4 12/11/2012 1810   GFRNONAA >90 12/11/2012 1810   GFRAA >90 12/11/2012 1810      ASSESSMENT AND PLAN 31 y.o. year old female  has a past medical history of Anemia, Asthma, Common migraine with intractable migraine (08/07/2016), and Migraine. here with:  1.  Migraine headache  The patient continues to have frequent migraines.  She does not feel that Zonegran is offering her much benefit at this point.  We will start the patient on nortriptyline 10 mg at bedtime.  I have reviewed side effects with the patient.  She denies any cardiac history.  She will remain on Zonegran at this time.  She will continue using the Imitrex nasal spray for acute therapy.  I will give the patient Zofran to use for nausea.  She is advised that if her headache frequency does not improve she should let us know.  She will follow-up in 4 months or sooner if needed  I spent 15 minutes with the patient. 50% of this time was spent discussing the medication nortriptyline.    Butch PennyMegan Makella Buckingham, MSN, NP-C 03/18/2017, 8:37 AM [facility] [address], [address] [place], [place] [address] [phone]

## 2017-03-18 NOTE — Progress Notes (Signed)
I have read the note, and I agree with the clinical assessment and plan.  Taysen Bushart K Siniyah Evangelist   

## 2017-04-08 ENCOUNTER — Other Ambulatory Visit: Payer: Self-pay | Admitting: Neurology

## 2017-05-01 ENCOUNTER — Telehealth: Payer: Self-pay | Admitting: Adult Health

## 2017-05-01 NOTE — Telephone Encounter (Signed)
If the patient feels that nortriptyline is the cause of the increase in her headaches then we can stop the medication to see if the headache frequency reduces. If nortriptyline has no effect on the headache frequency we could increase dose to 20 mg at bedtime to try to get better benefit.

## 2017-05-01 NOTE — Telephone Encounter (Signed)
Pt states when she was last in office she was told to call within a few weeks as to how she was feeling, pt states with the changes in her medication she feels worse and is asking to be called.

## 2017-05-01 NOTE — Telephone Encounter (Signed)
Rn call patient about Aundra MilletMegan NP advice. Rn stated she needs to stop the medication for a couple of days to see if it reduces her headaches. If the headaches are not reduce and its still the same 4 to 5 days of occurrence, increase nortriptyline to 20mg . Rn stated to stop for 3 days the minimum beginning today.. Rn also stated if she needs to start back the medication take 2 at [facility], and call Monday for a another rx of nortriptyline [facility]. Pt verbalized understanding. .Marland Kitchen

## 2017-05-01 NOTE — Telephone Encounter (Signed)
Rn call patient about increase in headaches. Pt stated since taking the nortriptyline she has had increase in headaches that last 4 to 5 days. Pt stated she has been on zonisamide, imitrex, and zofran, and phenegran before the new medication. She described the headaches are mostly at night time. She has sensitivity  to light on the peak of a headache, and vision is blurred in both eyes. When its worsen the vision in right eye is more impaired. Rn stated a [facility]. Pt verbalized understanding.

## 2017-06-17 MED ORDER — NORTRIPTYLINE HCL 10 MG PO CAPS: 20.0000 mg | ORAL_CAPSULE | Freq: Every day | ORAL | 1 refills | Status: DC

## 2017-06-17 NOTE — Telephone Encounter (Signed)
Done.  Pt has appt 07-16-17.

## 2017-06-17 NOTE — Telephone Encounter (Signed)
Patient requesting new Rx for nortriptyline (PAMELOR) 20 MG capsules. Dosage was increased from 10mg .. [facility].

## 2017-06-17 NOTE — Addendum Note (Signed)
Addended by: Guy BeginYOUNG, Lucy Woolever S on: 06/17/2017 02:03 PM   Modules accepted: Orders

## 2017-07-16 ENCOUNTER — Telehealth: Payer: Self-pay | Admitting: *Deleted

## 2017-07-16 ENCOUNTER — Ambulatory Visit: Payer: 59 | Admitting: Adult Health

## 2017-07-16 ENCOUNTER — Encounter: Payer: Self-pay | Admitting: Adult Health

## 2017-07-16 NOTE — Telephone Encounter (Signed)
Patient was no show for FU with NP today. 

## 2017-07-22 DIAGNOSIS — Z0289 Encounter for other administrative examinations: Secondary | ICD-10-CM

## 2017-07-23 ENCOUNTER — Telehealth: Payer: Self-pay | Admitting: Adult Health

## 2017-07-23 ENCOUNTER — Ambulatory Visit: Payer: 59 | Admitting: Adult Health

## 2017-07-23 ENCOUNTER — Encounter: Payer: Self-pay | Admitting: Adult Health

## 2017-07-23 VITALS — BP 128/82 | HR 88 | Wt 186.0 lb

## 2017-07-23 DIAGNOSIS — G43009 Migraine without aura, not intractable, without status migrainosus: Secondary | ICD-10-CM

## 2017-07-23 MED ORDER — NORTRIPTYLINE HCL 10 MG PO CAPS: 20.0000 mg | ORAL_CAPSULE | Freq: Every day | ORAL | 3 refills | Status: DC

## 2017-07-23 NOTE — Patient Instructions (Signed)
Your Plan:  Continue Nortriptyline 20 mg at bedtime Decrease Zonegran to 2 tablets at bedtime for 1 week then to 1 tablet at bedtime for 1 week then stop the medication.  If headaches worsen please let us know If your symptoms worsen or you develop new symptoms please let us know.   Thank you [facility]. I hope we have been able to provide you high quality care today.  You may receive a patient satisfaction survey over the next few weeks. We would appreciate your feedback and comments so that we may continue to improve ourselves and the health of our patients.

## 2017-07-23 NOTE — Progress Notes (Signed)
PATIENT: Linda Kirk DOB: 10-08-1985  REASON FOR VISIT: follow up HISTORY FROM: patient  HISTORY OF PRESENT ILLNESS: Today 07/23/17 [name] is a 32 year old female with a history of migraine headaches she returns today for follow-up.  She reports that since she started nortriptyline her migraine headaches have improved.  She states that she essentially has not had any migraine headaches.  She has one headache a week.  Reports that she does not have to use Imitrex for this headache.  Reports that it usually resolves spontaneously or with over-the-counter medication and to 3 hours.  She reports that she is happy with her current treatment.  She has remained on Zonegran 75 mg as well.  She returns today for evaluation.  HISTORY 03/18/17 [name] is a 32 year old female with a history of migraine headaches.  She returns today for follow-up.  At the last visit we increased Zonegran however she does not feel that is offered her any additional benefit.  She states that she has been taking Imitrex as soon as the headache starts.  She states most of the time this will resolve her headache fairly quickly but there are other times that she has no response. she reports that she has been on Maxalt in the past but she has not been on any other daily preventative medication.  She reports that her headaches typically start around the right eye.  She states with severe headache she may have some visual loss but reports that this has not occurred recently.  She does have photophobia but denies phonophobia.  She does report significant nausea but typically no vomiting she denies any weakness in the extremities.  She returns today for evaluation peer    REVIEW OF SYSTEMS: Out of a complete 14 system review of symptoms, the patient complains only of the following symptoms, and all other reviewed systems are negative.  See HPI  ALLERGIES: Allergies  Allergen Reactions  . Penicillins Anaphylaxis    Has  patient had a PCN reaction causing immediate rash, facial/tongue/throat swelling, SOB or lightheadedness with hypotension: yes  Has patient had a PCN reaction causing severe rash involving mucus membranes or skin necrosis: no  Has patient had a PCN reaction that required hospitalization: yes Has patient had a PCN reaction occurring within the last 10 years: no If all of the above answers are "NO", then may proceed with Cephalosporin use.     HOME MEDICATIONS: Outpatient Medications Prior to Visit  Medication Sig Dispense Refill  . ibuprofen (ADVIL,MOTRIN) 200 MG tablet Take 400 mg by mouth every 6 (six) hours as needed for headache, mild pain or moderate pain.    . LO LOESTRIN FE 1 MG-10 MCG / 10 MCG tablet Take 1 tablet by mouth daily.    . nortriptyline (PAMELOR) 10 MG capsule Take 2 capsules (20 mg total) by mouth at bedtime. 60 capsule 1  . ondansetron (ZOFRAN ODT) 4 MG disintegrating tablet Take 1 tablet (4 mg total) every 8 (eight) hours as needed by mouth for nausea or vomiting. 20 tablet 0  . promethazine (PHENERGAN) 25 MG suppository Place 1 suppository (25 mg total) rectally every 6 (six) hours as needed for nausea or vomiting. 12 each 3  . SUMAtriptan (IMITREX) 20 MG/ACT nasal spray PLACE 1 SPRAY (20 MG TOTAL) INTO THE NOSE 2 (TWO) TIMES DAILY AS NEEDED FOR MIGRAINE OR HEADACHE. 6 Inhaler 3  . zonisamide (ZONEGRAN) 25 MG capsule Take 3 capsules (75 mg total) by mouth 2 (two)  times daily. 180 capsule 5   No facility-administered medications prior to visit.     PAST MEDICAL HISTORY: Past Medical History:  Diagnosis Date  . Anemia   . Asthma   . Common migraine with intractable migraine 08/07/2016  . Migraine     PAST SURGICAL HISTORY: Past Surgical History:  Procedure Laterality Date  . pediatric surgery    . pyloric stenosis      FAMILY HISTORY: Family History  Problem Relation Age of Onset  . Headache Mother   . Depression Mother   . Migraines Mother   . High  blood pressure Father   . Hypertension Father   . Migraines Brother     SOCIAL HISTORY: Social History   Socioeconomic History  . Marital status: Single    Spouse name: Not on file  . Number of children: Not on file  . Years of education: Not on file  . Highest education level: Not on file  Occupational History  . Not on file  Social Needs  . Financial resource strain: Not on file  . Food insecurity:    Worry: Not on file    Inability: Not on file  . Transportation needs:    Medical: Not on file    Non-medical: Not on file  Tobacco Use  . Smoking status: Never Smoker  . Smokeless tobacco: Never Used  Substance and Sexual Activity  . Alcohol use: No  . Drug use: No  . Sexual activity: Yes  Lifestyle  . Physical activity:    Days per week: Not on file    Minutes per session: Not on file  . Stress: Not on file  Relationships  . Social connections:    Talks on phone: Not on file    Gets together: Not on file    Attends religious service: Not on file    Active member of club or organization: Not on file    Attends meetings of clubs or organizations: Not on file    Relationship status: Not on file  . Intimate partner violence:    Fear of current or ex partner: Not on file    Emotionally abused: Not on file    Physically abused: Not on file    Forced sexual activity: Not on file  Other Topics Concern  . Not on file  Social History Narrative   Lives with boyfriend Fredderick Erb) and 3 children   Caffeine use: Drinks coffee/tea (1 cup or more of coffee a day)   Right-handed      PHYSICAL EXAM  Vitals:   07/23/17 1454  BP: 128/82  Pulse: 88  Weight: 186 lb (84.4 kg)   Body mass index is 30.95 kg/m.  Generalized: Well developed, in no acute distress   Neurological examination  Mentation: Alert oriented to time, place, history taking. Follows all commands speech and language fluent Cranial nerve II-XII: Pupils were equal round reactive to light.  Extraocular movements were full, visual field were full on confrontational test. Facial sensation and strength were normal. Uvula tongue midline. Head turning and shoulder shrug  were normal and symmetric. Motor: The motor testing reveals 5 over 5 strength of all 4 extremities. Good symmetric motor tone is noted throughout.  Sensory: Sensory testing is intact to soft touch on all 4 extremities. No evidence of extinction is noted.  Coordination: Cerebellar testing reveals good finger-nose-finger and heel-to-shin bilaterally.  Gait and station: Gait is normal. Tandem gait is normal. Romberg is negative. No drift is seen.  Reflexes: Deep tendon reflexes are symmetric and normal bilaterally.   DIAGNOSTIC DATA (LABS, IMAGING, TESTING) - I reviewed patient records, labs, notes, testing and imaging myself where available.      ASSESSMENT AND PLAN 32 y.o. year old female  has a past medical history of Anemia, Asthma, Common migraine with intractable migraine (08/07/2016), and Migraine. here with:  1.  Migraine headaches  Overall the patient is doing well.  She will continue on nortriptyline 20 mg at bedtime.  Advised that we can slowly wean the patient off of Zonegran.  She will decrease to 2 tablets at bedtime for 1 week then 1 tablet at bedtime for 1 week then discontinue the medication.  If her headaches worsen while weaning off of Zonegran she should let us know.  She will follow-up in 1 year or sooner if needed.   I spent 15 minutes with the patient. 50% of this time was spent reviewing medication   Butch PennyMegan Ivory Bail, MSN, NP-C 07/23/2017, 3:08 PM [facility] [address] [place], [place] [address] [phone]

## 2017-07-23 NOTE — Progress Notes (Signed)
I have read the note, and I agree with the clinical assessment and plan.  Jeanetta Alonzo K Kamerin Grumbine   

## 2017-07-23 NOTE — Telephone Encounter (Signed)
Pt was seen on 3/27 by [name], Linda Kirk requested a 1 year follow up at check out. Pt stated she would need to call back and schedule this due to her work schedule.

## 2017-09-28 ENCOUNTER — Other Ambulatory Visit: Payer: Self-pay | Admitting: Adult Health

## 2017-11-11 ENCOUNTER — Telehealth: Payer: Self-pay | Admitting: Adult Health

## 2017-11-11 MED ORDER — ZONISAMIDE 25 MG PO CAPS: 75.0000 mg | ORAL_CAPSULE | Freq: Every day | ORAL | 11 refills | Status: DC

## 2017-11-11 NOTE — Telephone Encounter (Signed)
She was previously taking Zonegran 25mg , three tablets BID.  She has weaned herself down to 75mg  at bedtime and doing well, along with nortriptyline 20mg .  She attempted to go down to 50mg  at bedtime but developed headaches again.  She is requesting a prescription for Zonegran 75mg  at bedtime.  Per vo by Aundra MilletMegan, ok to provide this prescription. New rx sent to her pharmacy.

## 2017-11-11 NOTE — Telephone Encounter (Signed)
Pt has called asking if she can get a new prescription for ZONEGRAN Pt states she was asked to try and wein herself off this medication. Pt states anytime she tried going less than 3 tablets her head aches would come back.  Pt has scheduled her 1 yr f/u(08-24-2018) pt would like to know if she can [facility] [address] [facility] [place], [place] - [address] [phone] (Phone) [phone] (Fax)     Please call

## 2017-11-11 NOTE — Addendum Note (Signed)
Addended by: Lindell SparKIRKMAN, Sharne Linders C on: 11/11/2017 03:53 PM   Modules accepted: Orders

## 2018-07-20 ENCOUNTER — Other Ambulatory Visit: Payer: Self-pay | Admitting: Neurology

## 2018-08-04 ENCOUNTER — Telehealth: Payer: Self-pay

## 2018-08-04 NOTE — Telephone Encounter (Signed)
I contacted the pt in regards to the appt scheduled with MM on 08/24/18. I left a vm advising MM will still be on maternity leave and due to current COVID 19 pandemic, our office is severely reducing in office visits for at least the next 2 weeks, in order to minimize the risk to our patients and healthcare providers.   Pt was advised to call back and discuss using a virtual visit with Dr. Anne Hahn.

## 2018-08-11 ENCOUNTER — Other Ambulatory Visit: Payer: Self-pay | Admitting: Adult Health

## 2018-08-12 ENCOUNTER — Telehealth: Payer: Self-pay | Admitting: Neurology

## 2018-08-12 NOTE — Telephone Encounter (Signed)
This is a [name] patient on the schedule to see Aundra Millet while she is out on maternity leave. This appointment will need to be rescheduled and converted to video visit, if patient gives consent.

## 2018-08-13 NOTE — Telephone Encounter (Signed)
Pt is asking for a returned call from [place]

## 2018-08-13 NOTE — Telephone Encounter (Signed)
Unable to get in contact with the patient to schedule a webex visit with Margie Ege, NP for 08/20/2018. I left a voicemail asking the patient to return my call. Office number was provided.

## 2018-08-17 NOTE — Progress Notes (Signed)
    Virtual Visit via Video Note  I connected with Linda Kirk on 08/18/18 at  8:15 AM EDT by a video enabled telemedicine application and verified that I am speaking with the correct person using two identifiers.   I discussed the limitations of evaluation and management by telemedicine and the availability of in person appointments. The patient expressed understanding and agreed to proceed.  History of Present Illness: 08/18/2018 SS: Linda Kirk is a 33 year old female with a history of migraine headaches.  She is currently taking nortriptyline 20 mg a bedtime, she tried to wean off Zonegran however when she tried taking less than 3 tablets her headaches will come back.  She is currently taking Zonegran 75 mg at bedtime.  She is tolerating the medication well. She reports her headaches are doing much better, may have 1 headache a month.  She reports her headaches are rare now, she used to have them several times a week.  She has not had to use her Imitrex spray.  When she does get a headache she will lie down in a dark room, the headache will gradually subside.  She does not wish to try to decrease her Zonegran dose.  She denies any new problems or concerns.  She is currently working from home, has not had to travel recently for work.   07/23/17 MM: Linda Kirk is a 33 year old female with a history of migraine headaches she returns today for follow-up.  She reports that since she started nortriptyline her migraine headaches have improved.  She states that she essentially has not had any migraine headaches.  She has one headache a week.  Reports that she does not have to use Imitrex for this headache.  Reports that it usually resolves spontaneously or with over-the-counter medication and to 3 hours.  She reports that she is happy with her current treatment.  She has remained on Zonegran 75 mg as well.  She returns today for evaluation.   Observations/Objective: Alert, answers questions appropriately,  follows commands, facial symmetry noted, no arm drift, gait is intact  Assessment and Plan: 1.  Migraine headaches  Overall she is doing very well.  She reports she is having about 1 headache per month.  She will continue taking Zonegran 75 mg at bedtime, nortriptyline 20 mg at bedtime, Imitrex spray as needed, and Zofran as needed for nausea.  She does not wished to decrease her medication dosages at this time.  In the past she has tried to decrease her Zonegran dose, when she gets below 75 mg, her headaches return.  I will send in refills today.  Follow Up Instructions: 6 to 9 months for office revisit    I discussed the assessment and treatment plan with the patient. The patient was provided an opportunity to ask questions and all were answered. The patient agreed with the plan and demonstrated an understanding of the instructions.   The patient was advised to call back or seek an in-person evaluation if the symptoms worsen or if the condition fails to improve as anticipated.  I provided 20 minutes of non-face-to-face time during this encounter.   Otila Kluver, DNP  [facility] [address], [address] [place], [place] [address] [phone]

## 2018-08-17 NOTE — Telephone Encounter (Signed)
08-17-2018 pt has given verbal consent to file insurance for "televisitconsent" on 08-18-2018 pt email address:[email or web address] pt asked to download [employer] webex app/program https://www.webex.com/downloads.html phone rep discussed the consent for video and phone visits, copay, the limitations to the physical exam, etc.

## 2018-08-17 NOTE — Telephone Encounter (Signed)
Patient has been added to Sarah's webex calendar. E-mail has been sent.  

## 2018-08-18 ENCOUNTER — Ambulatory Visit (INDEPENDENT_AMBULATORY_CARE_PROVIDER_SITE_OTHER): Payer: 59 | Admitting: Neurology

## 2018-08-18 ENCOUNTER — Encounter: Payer: Self-pay | Admitting: Neurology

## 2018-08-18 DIAGNOSIS — G43019 Migraine without aura, intractable, without status migrainosus: Secondary | ICD-10-CM | POA: Diagnosis not present

## 2018-08-18 MED ORDER — ONDANSETRON 4 MG PO TBDP: 4.0000 mg | ORAL_TABLET | Freq: Three times a day (TID) | ORAL | 0 refills | Status: DC | PRN

## 2018-08-18 MED ORDER — ZONISAMIDE 25 MG PO CAPS: 75.0000 mg | ORAL_CAPSULE | Freq: Every day | ORAL | 11 refills | Status: DC

## 2018-08-18 MED ORDER — NORTRIPTYLINE HCL 10 MG PO CAPS
20.0000 mg | ORAL_CAPSULE | Freq: Every day | ORAL | 3 refills | Status: DC
Start: 2018-08-18 — End: 2019-06-30

## 2018-08-18 NOTE — Progress Notes (Signed)
I have read the note, and I agree with the clinical assessment and plan.  Latesha Chesney K Jolane Bankhead   

## 2018-08-24 ENCOUNTER — Ambulatory Visit: Payer: 59 | Admitting: Adult Health

## 2019-05-20 ENCOUNTER — Ambulatory Visit: Payer: Self-pay | Admitting: Neurology

## 2019-06-20 ENCOUNTER — Other Ambulatory Visit: Payer: Self-pay | Admitting: Neurology

## 2019-06-30 ENCOUNTER — Encounter: Payer: Self-pay | Admitting: Neurology

## 2019-06-30 ENCOUNTER — Ambulatory Visit: Payer: 59 | Admitting: Neurology

## 2019-06-30 ENCOUNTER — Telehealth: Payer: Self-pay | Admitting: Neurology

## 2019-06-30 ENCOUNTER — Other Ambulatory Visit: Payer: Self-pay

## 2019-06-30 VITALS — BP 109/77 | HR 106 | Temp 98.5°F | Ht 65.0 in | Wt 190.6 lb

## 2019-06-30 DIAGNOSIS — G43019 Migraine without aura, intractable, without status migrainosus: Secondary | ICD-10-CM | POA: Diagnosis not present

## 2019-06-30 MED ORDER — NORTRIPTYLINE HCL 10 MG PO CAPS: 20.0000 mg | ORAL_CAPSULE | Freq: Every day | ORAL | 3 refills | Status: DC

## 2019-06-30 MED ORDER — ONDANSETRON 4 MG PO TBDP: 4.0000 mg | ORAL_TABLET | Freq: Three times a day (TID) | ORAL | 1 refills | Status: AC | PRN

## 2019-06-30 MED ORDER — ZONISAMIDE 25 MG PO CAPS: 75.0000 mg | ORAL_CAPSULE | Freq: Every day | ORAL | 3 refills | Status: AC

## 2019-06-30 NOTE — Patient Instructions (Signed)
It was great to see you today! Continue current medications, refills were sent  Return in 1 year

## 2019-06-30 NOTE — Progress Notes (Signed)
I have read the note, and I agree with the clinical assessment and plan.  Baljit Liebert K Tatumn Corbridge   

## 2019-06-30 NOTE — Telephone Encounter (Signed)
Patient was given a one year follow-up to schedule. She states that she will call to schedule. FYI

## 2019-06-30 NOTE — Progress Notes (Signed)
PATIENT: Linda Kirk DOB: Apr 13, 1986  REASON FOR VISIT: follow up HISTORY FROM: patient  HISTORY OF PRESENT ILLNESS: Today 06/30/19  Linda Kirk is a 34 year old female with history of migraine headaches.  She is taking nortriptyline 20 mg at bedtime, Zonegran 75 mg at bedtime.  Her headaches return if she tries to decrease her dose of Zonegran.  Lately her headaches have increased slightly, as result of increased stress, she has been having to travel more by car for work.  She may have 2-3 migraines a month, Imitrex nasal spray works well for her.  She may take Zofran if needed for nausea with headache.  Overall, she is pleased with her headache control, increased stress accounts for slight increase of headaches, overall health has been good, denies any new problems or concerns.  HISTORY 08/18/2018 SS: Linda Kirk is a 34 year old female with a history of migraine headaches.  She is currently taking nortriptyline 20 mg a bedtime, she tried to wean off Zonegran however when she tried taking less than 3 tablets her headaches will come back.  She is currently taking Zonegran 75 mg at bedtime.  She is tolerating the medication well. She reports her headaches are doing much better, may have 1 headache a month.  She reports her headaches are rare now, she used to have them several times a week.  She has not had to use her Imitrex spray.  When she does get a headache she will lie down in a dark room, the headache will gradually subside.  She does not wish to try to decrease her Zonegran dose.  She denies any new problems or concerns.  She is currently working from home, has not had to travel recently for work.    REVIEW OF SYSTEMS: Out of a complete 14 system review of symptoms, the patient complains only of the following symptoms, and all other reviewed systems are negative.  Headache  ALLERGIES: Allergies  Allergen Reactions  . Penicillins Anaphylaxis    Has patient had a PCN reaction causing  immediate rash, facial/tongue/throat swelling, SOB or lightheadedness with hypotension: yes  Has patient had a PCN reaction causing severe rash involving mucus membranes or skin necrosis: no  Has patient had a PCN reaction that required hospitalization: yes Has patient had a PCN reaction occurring within the last 10 years: no If all of the above answers are "NO", then may proceed with Cephalosporin use.     HOME MEDICATIONS: Outpatient Medications Prior to Visit  Medication Sig Dispense Refill  . ibuprofen (ADVIL,MOTRIN) 200 MG tablet Take 400 mg by mouth every 6 (six) hours as needed for headache, mild pain or moderate pain.    Marland Kitchen norethindrone (MICRONOR) 0.35 MG tablet Take 1 tablet by mouth daily.    . SUMAtriptan (IMITREX) 20 MG/ACT nasal spray PLACE 1 SPRAY (20 MG TOTAL) INTO THE NOSE 2 (TWO) TIMES DAILY AS NEEDED FOR MIGRAINE OR HEADACHE. 1 Inhaler 1  . nortriptyline (PAMELOR) 10 MG capsule Take 2 capsules (20 mg total) by mouth at bedtime. 180 capsule 3  . ondansetron (ZOFRAN ODT) 4 MG disintegrating tablet Take 1 tablet (4 mg total) by mouth every 8 (eight) hours as needed for nausea or vomiting. 20 tablet 0  . zonisamide (ZONEGRAN) 25 MG capsule Take 3 capsules (75 mg total) by mouth at bedtime. 90 capsule 11  . LO LOESTRIN FE 1 MG-10 MCG / 10 MCG tablet Take 1 tablet by mouth daily.    . promethazine (PHENERGAN) 25  MG suppository Place 1 suppository (25 mg total) rectally every 6 (six) hours as needed for nausea or vomiting. 12 each 3   No facility-administered medications prior to visit.    PAST MEDICAL HISTORY: Past Medical History:  Diagnosis Date  . Anemia   . Asthma   . Common migraine with intractable migraine 08/07/2016  . Migraine     PAST SURGICAL HISTORY: Past Surgical History:  Procedure Laterality Date  . pediatric surgery    . pyloric stenosis      FAMILY HISTORY: Family History  Problem Relation Age of Onset  . Headache Mother   . Depression Mother     . Migraines Mother   . High blood pressure Father   . Hypertension Father   . Migraines Brother     SOCIAL HISTORY: Social History   Socioeconomic History  . Marital status: Single    Spouse name: Not on file  . Number of children: Not on file  . Years of education: Not on file  . Highest education level: Not on file  Occupational History  . Not on file  Tobacco Use  . Smoking status: Never Smoker  . Smokeless tobacco: Never Used  Substance and Sexual Activity  . Alcohol use: No  . Drug use: No  . Sexual activity: Yes  Other Topics Concern  . Not on file  Social History Narrative   Lives with boyfriend Fredderick Erb) and 3 children   Caffeine use: Drinks coffee/tea (1 cup or more of coffee a day)   Right-handed   Social Determinants of Health   Financial Resource Strain:   . Difficulty of Paying Living Expenses: Not on file  Food Insecurity:   . Worried About Programme researcher, broadcasting/film/video in the Last Year: Not on file  . Ran Out of Food in the Last Year: Not on file  Transportation Needs:   . Lack of Transportation (Medical): Not on file  . Lack of Transportation (Non-Medical): Not on file  Physical Activity:   . Days of Exercise per Week: Not on file  . Minutes of Exercise per Session: Not on file  Stress:   . Feeling of Stress : Not on file  Social Connections:   . Frequency of Communication with Friends and Family: Not on file  . Frequency of Social Gatherings with Friends and Family: Not on file  . Attends Religious Services: Not on file  . Active Member of Clubs or Organizations: Not on file  . Attends Banker Meetings: Not on file  . Marital Status: Not on file  Intimate Partner Violence:   . Fear of Current or Ex-Partner: Not on file  . Emotionally Abused: Not on file  . Physically Abused: Not on file  . Sexually Abused: Not on file   PHYSICAL EXAM  Vitals:   06/30/19 1103  BP: 109/77  Pulse: (!) 106  Temp: 98.5 F (36.9 C)  TempSrc: Oral   Weight: 190 lb 9.6 oz (86.5 kg)  Height: 5\' 5"  (1.651 m)   Body mass index is 31.72 kg/m.  Generalized: Well developed, in no acute distress   Neurological examination  Mentation: Alert oriented to time, place, history taking. Follows all commands speech and language fluent Cranial nerve II-XII: Pupils were equal round reactive to light. Extraocular movements were full, visual field were full on confrontational test. Facial sensation and strength were normal. Head turning and shoulder shrug  were normal and symmetric. Motor: The motor testing reveals 5 over 5  strength of all 4 extremities. Good symmetric motor tone is noted throughout.  Sensory: Sensory testing is intact to soft touch on all 4 extremities. No evidence of extinction is noted.  Coordination: Cerebellar testing reveals good finger-nose-finger and heel-to-shin bilaterally.  Gait and station: Gait is normal. Tandem gait is normal. Romberg is negative. No drift is seen.  Reflexes: Deep tendon reflexes are symmetric and normal bilaterally.   DIAGNOSTIC DATA (LABS, IMAGING, TESTING) - I reviewed patient records, labs, notes, testing and imaging myself where available.  Lab Results  Component Value Date   WBC 5.5 05/08/2013   HGB 8.8 (L) 05/08/2013   HCT 25.5 (L) 05/08/2013   MCV 88.2 05/08/2013   PLT 199 05/08/2013      Component Value Date/Time   NA 134 (L) 12/11/2012 1810   K 3.1 (L) 12/11/2012 1810   CL 100 12/11/2012 1810   CO2 24 12/11/2012 1810   GLUCOSE 84 12/11/2012 1810   BUN 5 (L) 12/11/2012 1810   CREATININE 0.53 12/11/2012 1810   CALCIUM 9.3 12/11/2012 1810   PROT 6.8 12/11/2012 1810   ALBUMIN 3.5 12/11/2012 1810   AST 14 12/11/2012 1810   ALT 6 12/11/2012 1810   ALKPHOS 47 12/11/2012 1810   BILITOT 0.4 12/11/2012 1810   GFRNONAA >90 12/11/2012 1810   GFRAA >90 12/11/2012 1810   No results found for: CHOL, HDL, LDLCALC, LDLDIRECT, TRIG, CHOLHDL No results found for: HGBA1C No results found for:  VITAMINB12 No results found for: TSH  ASSESSMENT AND PLAN 34 y.o. year old female  has a past medical history of Anemia, Asthma, Common migraine with intractable migraine (08/07/2016), and Migraine. here with:  1.  Migraine headaches -Overall, doing well, slight increase in headache frequency, due to increased stress, on average 2-3 headaches a month, good benefit intranasal spray -Continue nortriptyline 20 mg at bedtime, HR slightly elevated 106 on exam, denies symptomatic concern -Continue Zonegran 75 mg at bedtime -Continue Imitrex nasal spray as needed, along with Zofran ODT if needed -Follow-up in 1 year or sooner if needed  I spent 15 minutes with the patient. 50% of this time was spent discussing her plan of care.  Butler Denmark, AGNP-C, DNP 06/30/2019, 11:24 AM [facility] [address], [place], [place] [address] [phone]

## 2019-08-18 ENCOUNTER — Other Ambulatory Visit: Payer: Self-pay | Admitting: Neurology

## 2019-10-26 ENCOUNTER — Other Ambulatory Visit: Payer: Self-pay | Admitting: Neurology

## 2020-05-29 ENCOUNTER — Other Ambulatory Visit: Payer: Self-pay

## 2020-05-29 ENCOUNTER — Inpatient Hospital Stay (HOSPITAL_COMMUNITY): Payer: 59

## 2020-05-29 ENCOUNTER — Encounter (HOSPITAL_COMMUNITY): Payer: Self-pay | Admitting: Family Medicine

## 2020-05-29 ENCOUNTER — Inpatient Hospital Stay (HOSPITAL_COMMUNITY)
Admission: AD | Admit: 2020-05-29 | Discharge: 2020-05-29 | Disposition: A | Discharge status: Home / Self Care | Payer: 59 | Attending: Family Medicine | Admitting: Family Medicine

## 2020-05-29 DIAGNOSIS — O3680X0 Pregnancy with inconclusive fetal viability, not applicable or unspecified: Secondary | ICD-10-CM

## 2020-05-29 DIAGNOSIS — Z79899 Other long term (current) drug therapy: Secondary | ICD-10-CM | POA: Diagnosis not present

## 2020-05-29 DIAGNOSIS — O4690 Antepartum hemorrhage, unspecified, unspecified trimester: Secondary | ICD-10-CM | POA: Diagnosis not present

## 2020-05-29 DIAGNOSIS — Z3A08 8 weeks gestation of pregnancy: Secondary | ICD-10-CM | POA: Diagnosis not present

## 2020-05-29 DIAGNOSIS — Z880 Allergy status to penicillin: Secondary | ICD-10-CM | POA: Diagnosis not present

## 2020-05-29 DIAGNOSIS — O99351 Diseases of the nervous system complicating pregnancy, first trimester: Secondary | ICD-10-CM | POA: Insufficient documentation

## 2020-05-29 DIAGNOSIS — R109 Unspecified abdominal pain: Secondary | ICD-10-CM | POA: Diagnosis not present

## 2020-05-29 DIAGNOSIS — G43709 Chronic migraine without aura, not intractable, without status migrainosus: Secondary | ICD-10-CM | POA: Diagnosis not present

## 2020-05-29 DIAGNOSIS — O26891 Other specified pregnancy related conditions, first trimester: Secondary | ICD-10-CM | POA: Insufficient documentation

## 2020-05-29 DIAGNOSIS — O26899 Other specified pregnancy related conditions, unspecified trimester: Secondary | ICD-10-CM | POA: Diagnosis not present

## 2020-05-29 DIAGNOSIS — Z3A00 Weeks of gestation of pregnancy not specified: Secondary | ICD-10-CM

## 2020-05-29 DIAGNOSIS — O09521 Supervision of elderly multigravida, first trimester: Secondary | ICD-10-CM | POA: Diagnosis not present

## 2020-05-29 DIAGNOSIS — O209 Hemorrhage in early pregnancy, unspecified: Secondary | ICD-10-CM | POA: Diagnosis present

## 2020-05-29 LAB — CBC WITH DIFFERENTIAL/PLATELET
Abs Immature Granulocytes: 0.01 10*3/uL (ref 0.00–0.07)
Basophils Absolute: 0 10*3/uL (ref 0.0–0.1)
Basophils Relative: 1 %
Eosinophils Absolute: 0.1 10*3/uL (ref 0.0–0.5)
Eosinophils Relative: 1 %
HCT: 39.3 % (ref 36.0–46.0)
Hemoglobin: 13.1 g/dL (ref 12.0–15.0)
Immature Granulocytes: 0 %
Lymphocytes Relative: 26 %
Lymphs Abs: 1.6 10*3/uL (ref 0.7–4.0)
MCH: 31.9 pg (ref 26.0–34.0)
MCHC: 33.3 g/dL (ref 30.0–36.0)
MCV: 95.6 fL (ref 80.0–100.0)
Monocytes Absolute: 0.3 10*3/uL (ref 0.1–1.0)
Monocytes Relative: 5 %
Neutro Abs: 4.2 10*3/uL (ref 1.7–7.7)
Neutrophils Relative %: 67 %
Platelets: 305 10*3/uL (ref 150–400)
RBC: 4.11 MIL/uL (ref 3.87–5.11)
RDW: 12.5 % (ref 11.5–15.5)
WBC: 6.3 10*3/uL (ref 4.0–10.5)
nRBC: 0 % (ref 0.0–0.2)

## 2020-05-29 LAB — COMPREHENSIVE METABOLIC PANEL
ALT: 14 U/L (ref 0–44)
AST: 17 U/L (ref 15–41)
Albumin: 4.2 g/dL (ref 3.5–5.0)
Alkaline Phosphatase: 75 U/L (ref 38–126)
Anion gap: 10 (ref 5–15)
BUN: 6 mg/dL (ref 6–20)
CO2: 23 mmol/L (ref 22–32)
Calcium: 9.2 mg/dL (ref 8.9–10.3)
Chloride: 105 mmol/L (ref 98–111)
Creatinine, Ser: 0.75 mg/dL (ref 0.44–1.00)
GFR, Estimated: >60 mL/min (ref >60)
Glucose, Bld: 86 mg/dL (ref 70–99)
Potassium: 3.8 mmol/L (ref 3.5–5.1)
Sodium: 138 mmol/L (ref 135–145)
Total Bilirubin: 0.6 mg/dL (ref 0.3–1.2)
Total Protein: 7.3 g/dL (ref 6.5–8.1)

## 2020-05-29 LAB — URINALYSIS, ROUTINE W REFLEX MICROSCOPIC
Bacteria, UA: NONE SEEN
Bilirubin Urine: NEGATIVE
Glucose, UA: NEGATIVE mg/dL
Ketones, ur: NEGATIVE mg/dL
Leukocytes,Ua: NEGATIVE
Nitrite: NEGATIVE
Protein, ur: NEGATIVE mg/dL
Specific Gravity, Urine: 1.01 (ref 1.005–1.030)
pH: 8 (ref 5.0–8.0)

## 2020-05-29 LAB — WET PREP, GENITAL
Sperm: NONE SEEN
Trich, Wet Prep: NONE SEEN
Yeast Wet Prep HPF POC: NONE SEEN

## 2020-05-29 LAB — HIV ANTIBODY (ROUTINE TESTING W REFLEX): HIV Screen 4th Generation wRfx: NONREACTIVE

## 2020-05-29 LAB — HCG, QUANTITATIVE, PREGNANCY: hCG, Beta Chain, Quant, S: 21 m[IU]/mL — ABNORMAL HIGH (ref <5)

## 2020-05-29 LAB — POCT PREGNANCY, URINE: Preg Test, Ur: POSITIVE — AB

## 2020-05-29 NOTE — MAU Provider Note (Cosign Needed Addendum)
Chief Complaint:  Vaginal Bleeding   None    HPI: Linda Kirk is a 35 y.o. [id number] at [redacted]w[redacted]d who presents to maternity admissions reporting vaginal bleeding and abdominal cramping. This morning she went in for a fetal US and was sent MAU due to symptoms. Patient states she starting having vaginal spotting 3 days and it has gradually worsened. Color initially brown but now is bright red and heavier. She has been wearing a pad but has not soaked through. She also complains of abdominal cramping that started yesterday and states it is less painful than her normal menstrual cramps. Pain in the lower mid-abdomen. She has not taken any medications to relieve the pain and she has not noticed any aggravating or alleviating factors. Denies nausea, vomiting, headache, urinary symptoms, fever, falls, or recent illness. PMH significant for 2 SAB's and migraines on daily prophylactic treatment.    Location: lower mid-abdomen Quality: cramping Severity: 4/10 in pain scale Duration: intermittent Timing: Started yesterday Modifying factors: none Associated signs and symptoms: vaginal bleeding  Pregnancy Course: She has established prenatal care but has not yet had a visit.  Past Medical History:  Diagnosis Date  . Anemia   . Asthma   . Common migraine with intractable migraine 08/07/2016  . Migraine    OB History  Gravida Para Term Preterm AB Living  6 3 3  0 2 3  SAB IAB Ectopic Multiple Live Births  2 0 0 0 1    # Outcome Date GA Lbr Len/2nd Weight Sex Delivery Anes PTL Lv  6 Current           5 Term 05/07/13 [redacted]w[redacted]d 04:52 / 03:03 3555 g F Vag-Spont EPI  LIV     Birth Comments: None  4 SAB           3 SAB           2 Term           1 Term            Past Surgical History:  Procedure Laterality Date  . pediatric surgery    . pyloric stenosis     Family History  Problem Relation Age of Onset  . Headache Mother   . Depression Mother   . Migraines Mother   . High blood pressure Father    . Hypertension Father   . Migraines Brother    Social History   Tobacco Use  . Smoking status: Never Smoker  . Smokeless tobacco: Never Used  Substance Use Topics  . Alcohol use: No  . Drug use: No   Allergies  Allergen Reactions  . Penicillins Anaphylaxis    Has patient had a PCN reaction causing immediate rash, facial/tongue/throat swelling, SOB or lightheadedness with hypotension: yes  Has patient had a PCN reaction causing severe rash involving mucus membranes or skin necrosis: no  Has patient had a PCN reaction that required hospitalization: yes Has patient had a PCN reaction occurring within the last 10 years: no If all of the above answers are "NO", then may proceed with Cephalosporin use.    Medications Prior to Admission  Medication Sig Dispense Refill Last Dose  . nortriptyline (PAMELOR) 10 MG capsule Take 2 capsules (20 mg total) by mouth at bedtime. 180 capsule 3 05/28/2020 at Unknown time  . SUMAtriptan (IMITREX) 20 MG/ACT nasal spray PLACE 1 SPRAY (20 MG TOTAL) INTO THE NOSE 2 (TWO) TIMES DAILY AS NEEDED FOR MIGRAINE OR HEADACHE. 1 Inhaler 1 Past  Month at Unknown time  . zonisamide (ZONEGRAN) 25 MG capsule Take 3 capsules (75 mg total) by mouth at bedtime. 270 capsule 3 05/28/2020 at Unknown time  . ibuprofen (ADVIL,MOTRIN) 200 MG tablet Take 400 mg by mouth every 6 (six) hours as needed for headache, mild pain or moderate pain.     Marland Kitchen norethindrone (MICRONOR) 0.35 MG tablet Take 1 tablet by mouth daily.     . ondansetron (ZOFRAN ODT) 4 MG disintegrating tablet Take 1 tablet (4 mg total) by mouth every 8 (eight) hours as needed for nausea or vomiting. 20 tablet 1     I have reviewed patient's Past Medical Hx, Surgical Hx, Family Hx, Social Hx, medications and allergies.   ROS:  Review of Systems  Constitutional: Negative for chills, diaphoresis and fever.  Respiratory: Negative for shortness of breath, wheezing and stridor.   Cardiovascular: Negative for chest pain,  palpitations and leg swelling.  Gastrointestinal: Positive for abdominal pain. Negative for abdominal distention, constipation, diarrhea, nausea and vomiting.  Genitourinary: Positive for vaginal bleeding. Negative for difficulty urinating, dysuria and urgency.  Neurological: Negative for syncope, light-headedness and headaches.    Physical Exam   Patient Vitals for the past 24 hrs:  BP Temp Pulse Resp Weight  05/29/20 1505 126/81 98.8 F (37.1 C) 94 19 81.4 kg    Constitutional: Well-developed, well-nourished female in no acute distress.  Cardiovascular: normal rate & rhythm, no murmur Respiratory: normal effort, lung sounds clear throughout GI: Abd soft, non-tender, gravid appropriate for gestational age. Pos BS x 4 MS: Extremities nontender, no edema, normal ROM Neurologic: Alert and oriented x 4.  GU: no CVA tenderness Pelvic: Normal external female genitalia and vaginal walls, physiologic discharge, minimal dark red blood, soaked 1 fox swab, cervix pink, visually closed, without lesion. Bimanual: cervix closed and firm      Labs: Results for orders placed or performed during the hospital encounter of 05/29/20 (from the past 24 hour(s))  Urinalysis, Routine w reflex microscopic Urine, Clean Catch     Status: Abnormal   Collection Time: 05/29/20  3:05 PM  Result Value Ref Range   Color, Urine YELLOW YELLOW   APPearance CLEAR CLEAR   Specific Gravity, Urine 1.010 1.005 - 1.030   pH 8.0 5.0 - 8.0   Glucose, UA NEGATIVE NEGATIVE mg/dL   Hgb urine dipstick SMALL (A) NEGATIVE   Bilirubin Urine NEGATIVE NEGATIVE   Ketones, ur NEGATIVE NEGATIVE mg/dL   Protein, ur NEGATIVE NEGATIVE mg/dL   Nitrite NEGATIVE NEGATIVE   Leukocytes,Ua NEGATIVE NEGATIVE   Bacteria, UA NONE SEEN NONE SEEN   Squamous Epithelial / LPF 0-5 0 - 5  Pregnancy, urine POC     Status: Abnormal   Collection Time: 05/29/20  3:11 PM  Result Value Ref Range   Preg Test, Ur POSITIVE (A) NEGATIVE  CBC with  Differential/Platelet     Status: None   Collection Time: 05/29/20  3:49 PM  Result Value Ref Range   WBC 6.3 4.0 - 10.5 K/uL   RBC 4.11 3.87 - 5.11 MIL/uL   Hemoglobin 13.1 12.0 - 15.0 g/dL   HCT 50.3 54.6 - 56.8 %   MCV 95.6 80.0 - 100.0 fL   MCH 31.9 26.0 - 34.0 pg   MCHC 33.3 30.0 - 36.0 g/dL   RDW 12.7 51.7 - 00.1 %   Platelets 305 150 - 400 K/uL   nRBC 0.0 0.0 - 0.2 %   Neutrophils Relative % 67 %   Neutro Abs  4.2 1.7 - 7.7 K/uL   Lymphocytes Relative 26 %   Lymphs Abs 1.6 0.7 - 4.0 K/uL   Monocytes Relative 5 %   Monocytes Absolute 0.3 0.1 - 1.0 K/uL   Eosinophils Relative 1 %   Eosinophils Absolute 0.1 0.0 - 0.5 K/uL   Basophils Relative 1 %   Basophils Absolute 0.0 0.0 - 0.1 K/uL   Immature Granulocytes 0 %   Abs Immature Granulocytes 0.01 0.00 - 0.07 K/uL  Comprehensive metabolic panel     Status: None   Collection Time: 05/29/20  3:49 PM  Result Value Ref Range   Sodium 138 135 - 145 mmol/L   Potassium 3.8 3.5 - 5.1 mmol/L   Chloride 105 98 - 111 mmol/L   CO2 23 22 - 32 mmol/L   Glucose, Bld 86 70 - 99 mg/dL   BUN 6 6 - 20 mg/dL   Creatinine, Ser 7.79 0.44 - 1.00 mg/dL   Calcium 9.2 8.9 - 39.0 mg/dL   Total Protein 7.3 6.5 - 8.1 g/dL   Albumin 4.2 3.5 - 5.0 g/dL   AST 17 15 - 41 U/L   ALT 14 0 - 44 U/L   Alkaline Phosphatase 75 38 - 126 U/L   Total Bilirubin 0.6 0.3 - 1.2 mg/dL   GFR, Estimated >30 >09 mL/min   Anion gap 10 5 - 15  ABO/Rh     Status: None   Collection Time: 05/29/20  3:49 PM  Result Value Ref Range   ABO/RH(D)      B POS Performed at [facility], [address] [place], [place] [address]   hCG, quantitative, pregnancy     Status: Abnormal   Collection Time: 05/29/20  3:49 PM  Result Value Ref Range   hCG, Beta Chain, Quant, S 21 (H) <5 mIU/mL  HIV Antibody (routine testing w rflx)     Status: None   Collection Time: 05/29/20  3:49 PM  Result Value Ref Range   HIV Screen 4th Generation wRfx Non Reactive Non Reactive  Wet  prep, genital     Status: Abnormal   Collection Time: 05/29/20  4:20 PM   Specimen: Vaginal  Result Value Ref Range   Yeast Wet Prep HPF POC NONE SEEN NONE SEEN   Trich, Wet Prep NONE SEEN NONE SEEN   Clue Cells Wet Prep HPF POC PRESENT (A) NONE SEEN   WBC, Wet Prep HPF POC MANY (A) NONE SEEN   Sperm NONE SEEN     Imaging:  US OB LESS THAN 14 WEEKS WITH OB TRANSVAGINAL  Result Date: 05/29/2020 CLINICAL DATA:  Vaginal bleeding. EXAM: OBSTETRIC <14 WK [place] AND TRANSVAGINAL OB US TECHNIQUE: Both transabdominal and transvaginal ultrasound examinations were performed for complete evaluation of the gestation as well as the maternal uterus, adnexal regions, and pelvic cul-de-sac. Transvaginal technique was performed to assess early pregnancy. COMPARISON:  None. FINDINGS: Intrauterine gestational sac: None Yolk sac:  Not Visualized. Embryo:  Not Visualized. Cardiac Activity: Not Visualized. Maternal uterus/adnexae: Ovaries are unremarkable. No free fluid is noted. IMPRESSION: No intrauterine gestational sac, yolk sac, fetal pole, or cardiac activity visualized. Differential considerations include intrauterine gestation too early to be sonographically visualized, spontaneous abortion, or ectopic pregnancy. Consider follow-up ultrasound in 14 days and serial quantitative beta HCG follow-up. Electronically Signed   By: Lupita Raider M.D.   On: 05/29/2020 16:49    MAU Course: Orders Placed This Encounter  Procedures  . Wet prep, genital  .  US OB LESS THAN 14 WEEKS WITH OB TRANSVAGINAL  . Urinalysis, Routine w reflex microscopic Urine, Clean Catch  . CBC with Differential/Platelet  . Comprehensive metabolic panel  . hCG, quantitative, pregnancy  . HIV Antibody (routine testing w rflx)  . Pregnancy, urine POC  . ABO/Rh   No orders of the defined types were placed in this encounter.   MDM: Workup initiated to rule out ectopic pregnancy or infection.  CBC within normal limits. ABO/Rh: B positive.  UA essentially normal, did have small amount of hgb on dipstick which is to be expected d/t vaginal bleeding. Wet prep revealed clue cells and WBCs, however, patient is not showing any symptoms of BV infection. CMP within normal limits. US impression showed no intrauterine gestational sac, yolk sac, fetal pole or cardiac activity. No definitive diagnosis, differentials to consider: too early to visualize, SAB or ectopic with recommendation for follow up U/S in 14 days and serial quant beta hCG.  Assessment: 1. Abdominal pain in pregnancy   2. Vaginal bleeding during pregnancy     Plan: Discharge home in stable condition. Kristeen Miss, [place] Student 05/29/2020   PA students attestation:  I have seen and examined this patient and agree with above documentation in the PA' s note.   Linda Kirk is a 35 y.o. [phone] female reporting abdominal pain and vaginal bleeding that has been present for 3 days. The bleeding has gotten heavier. The blood was initially brown and now is a darker red. She has not taken anything for the abdominal pain.   Associated symptoms:  Neg fever and chills Pos abdominal pain Pos vaginal bleeding Neg vaginal discharge Neg urinary complaints Neg GI complaints  PE: Patient Vitals for the past 24 hrs:  BP Temp Pulse Resp SpO2 Weight  05/29/20 1718 130/81 - (!) 102 14 100 % -  05/29/20 1505 126/81 98.8 F (37.1 C) 94 19 - 81.4 kg   Gen: calm comfortable, NAD Resp: normal effort, no distress Heart: Regular rate Abd: Soft, NT, Pos BS x 4 Neuro: A&O x 4 Pelvic: Normal external female genitalia and vaginal walls, physiologic discharge, minimal dark red blood, soaked 1 fox swab, cervix pink, visually closed, without lesion. Speculum exam by Kristeen Miss, PA student  Bimanual: cervix closed and firm Exam by Venia Carbon, NP   ROS, labs, PMH reviewed  Orders Placed This Encounter  Procedures  . Wet prep, genital  . US OB LESS THAN 14 WEEKS WITH OB TRANSVAGINAL   . Urinalysis, Routine w reflex microscopic Urine, Clean Catch  . CBC with Differential/Platelet  . Comprehensive metabolic panel  . hCG, quantitative, pregnancy  . HIV Antibody (routine testing w rflx)  . Pregnancy, urine POC  . ABO/Rh  . Discharge patient   No orders of the defined types were placed in this encounter.   MDM  B positive blood type  Wet prep & GC HIV, CBC, Hcg, ABO US OB transvaginal  Clue cells noted on wet prep, patient without discharge or odor.   Assessment 1. Pregnancy of unknown anatomic location   2. Abdominal pain in pregnancy   3. Vaginal bleeding during pregnancy     Plan: - Discharge home in stable condition. - [facility] 1400 for stat Quant.  - Return to maternity admissions symptoms worsen - Pelvic rest - Ectopic precautions   Rasch, Harolyn Rutherford, NP 05/29/2020 7:10 PM

## 2020-05-29 NOTE — Discharge Instructions (Signed)
Threatened Miscarriage A threatened miscarriage is when a woman bleeds in her vagina during the first 20 weeks of pregnancy but the pregnancy has not ended. The doctor will do tests to make sure you are still pregnant. This condition does not mean your pregnancy will end, but it does increase the risk that it will end (miscarriage). What are the causes? Normally, the cause of this condition is not known. What increases the risk? These things may make a pregnant woman more likely to lose a pregnancy: Certain health problems  Conditions that affect hormones, such as thyroid disease or polycystic ovary syndrome.  Diabetes.  Disorders that cause the body's disease-fighting system to attack itself by mistake.  Infections.  Bleeding problems.  Being very overweight. Lifestyle factors  Using products that have tobacco or nicotine in them.  Being around tobacco smoke.  Having a lot of caffeine.  Using drugs. Problems with reproductive organs or parts  Having a cervix that opens and thins before you are ready to give birth. The cervix is the lowest part of the womb.  Having Asherman syndrome, which leads to: ? Scars in the womb. ? The womb being an abnormal shape.  Growths (fibroids) in the womb.  Problems in the body that are present at birth.  Infection of the cervix or womb. Personal or health history  Injury.  Having lost an unborn baby before.  Being younger than age 18 or older than age 35.  Being around a harmful substance, such as radiation.  Having lead or other heavy metals in: ? Things you eat or drink. ? The air around you.  Using certain medicines. What are the signs or symptoms?  Bleeding from the vagina. You may also have cramps or pain.  Mild pain or cramps in your belly. How is this diagnosed?  The doctor will do tests, such as an ultrasound to make sure you are still pregnant.   How is this treated? There are no treatments that prevent loss of  pregnancy. But you need to do the right things to take care of yourself at home. Follow these instructions at home:  Get a lot of rest.  Do not have sex or douche if there is bleeding in the vagina.  Do not put things such as tampons in the vagina if it is bleeding.  Do not smoke or use drugs.  Do not drink alcohol.  Avoid caffeine.  Keep all follow-up visits while you are pregnant. Contact a doctor if:  You are pregnant and you have one of these: ? Light bleeding coming from your vagina. ? Spots of blood coming from your vagina.  You have belly pain or cramping.  You have a fever. Get help right away if:  Blood soaks through 2 large pads an hour for more than 2 hours.  Clots of blood come from your vagina.  Tissue comes out of your vagina.  Fluid leaks or gushes from your vagina.  You have very bad pain in your low back.  You have very bad cramps in your belly.  You have a fever, chills, and very bad belly pain. Summary  A threatened miscarriage is when a woman bleeds in her vagina during the first 20 weeks of pregnancy but the pregnancy has not ended.  Normally, the cause of this condition is not known.  Symptoms include bleeding in the vagina or mild pain or cramps in your belly.  There are no treatments that prevent loss of pregnancy.  Keep   all follow-up visits while you are pregnant. This information is not intended to replace advice given to you by your health care provider. Make sure you discuss any questions you have with your health care provider. Document Revised: 10/15/2019 Document Reviewed: 10/15/2019 Elsevier Patient Education  2021 [employer]  

## 2020-05-29 NOTE — MAU Note (Signed)
Patient reports VB since Friday and started having abdominal cramping beginning yesterday.  States the bleeding started as spotting and has progressively gotten heavier.  LMP 04/01/20.  Had not started [facility] appointment.  Was going to the pregnancy center for an U/S but was sent here due to having vaginal bleeding.

## 2020-05-30 LAB — ABO/RH: ABO/RH(D): B POS

## 2020-05-30 LAB — GC/CHLAMYDIA PROBE AMP (~~LOC~~) NOT AT ARMC
Chlamydia: NEGATIVE
Comment: NEGATIVE
Comment: NORMAL
Neisseria Gonorrhea: NEGATIVE

## 2020-05-31 ENCOUNTER — Ambulatory Visit (INDEPENDENT_AMBULATORY_CARE_PROVIDER_SITE_OTHER): Payer: 59

## 2020-05-31 ENCOUNTER — Other Ambulatory Visit: Payer: Self-pay

## 2020-05-31 DIAGNOSIS — O3680X0 Pregnancy with inconclusive fetal viability, not applicable or unspecified: Secondary | ICD-10-CM | POA: Diagnosis not present

## 2020-05-31 LAB — BETA HCG QUANT (REF LAB): hCG Quant: 9 m[IU]/mL

## 2020-05-31 NOTE — Progress Notes (Signed)
Pt here today for STAT Beta for s/p pregnancy of unknown location.  Pt reports that she is here today to get her levels drawn to see if they rise.  Pt informed me that she is continuing to have vaginal spotting that she changes a panty liner about two times a day.  Pt denies pain.  Pt advised that I will call her around  5pm with results and f/u.  Pt verbalized understanding with no further questions.   Received notification from [facility] that pt's beta results is 9.  Reviewed results with Dr. Myriam Jacobson who recommends that pt levels present that pt has miscarried and that her beta levels is sufficient that she does not need to have a f/u beta.  Provider also recommended that if pt desires f/u appt for SAB or discussion of BC pt should have a provider appt in two weeks.  I informed pt provider's recommendation.   Pt states that she will be out of town and at this time does not feel the need for the appt.  I advised pt that if she should have any questions/concerns to please give the office a call.  Pt verbalized understanding.    Addison Naegeli, RN  05/31/20

## 2020-06-02 NOTE — Progress Notes (Signed)
I agree with the findings and care plan as documented in the nursing note.   Alanta Scobey, MD OB Family Medicine Fellow, [facility], [facility]  

## 2020-07-20 ENCOUNTER — Other Ambulatory Visit: Payer: Self-pay | Admitting: Neurology

## 2021-09-15 IMAGING — US US OB < 14 WEEKS - US OB TV
1 series · 15 of 28 positions shown · non-contrast
Comparison: None.

CLINICAL DATA: Vaginal bleeding.

EXAM:
OBSTETRIC <14 WK US AND TRANSVAGINAL OB US
TECHNIQUE: Both transabdominal and transvaginal ultrasound examinations were
performed for complete evaluation of the gestation as well as the
maternal uterus, adnexal regions, and pelvic cul-de-sac.
Transvaginal technique was performed to assess early pregnancy.

[Series 1: us ob < 14 weeks - us ob tv · 15 of 40 slices shown]
[im 1/40]
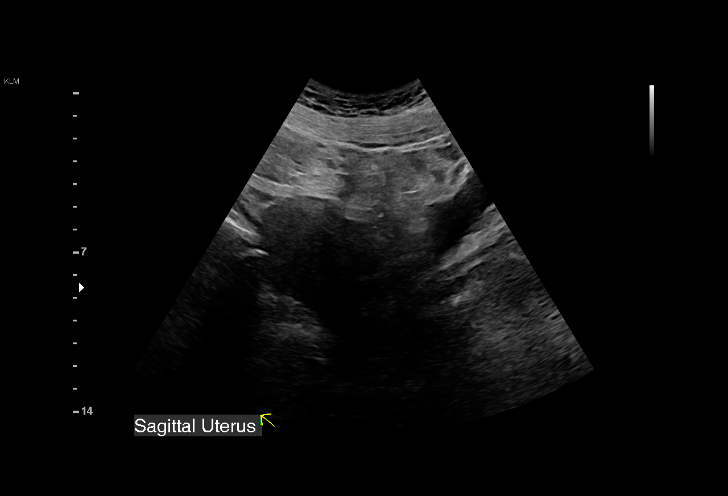
[im 3/40]
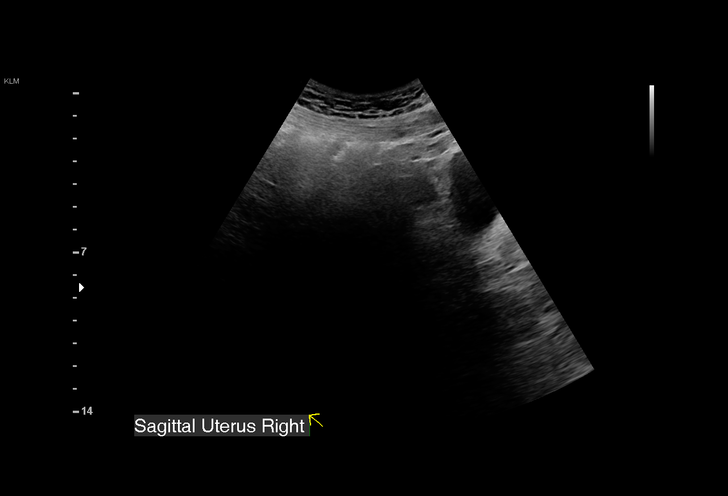
[im 6/40]
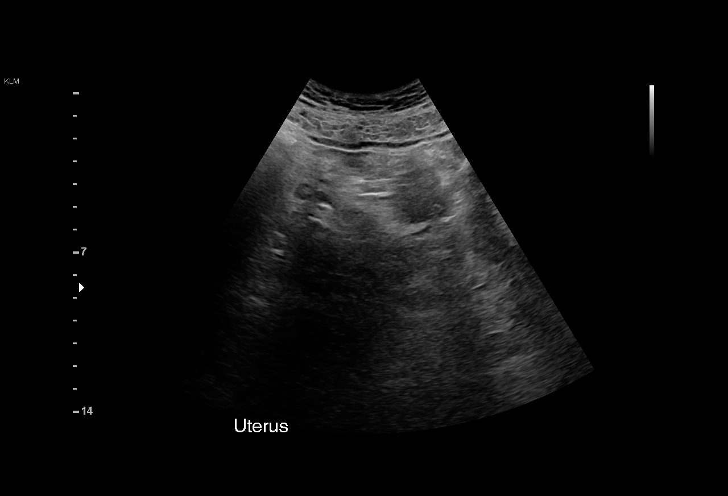
[im 9/40]
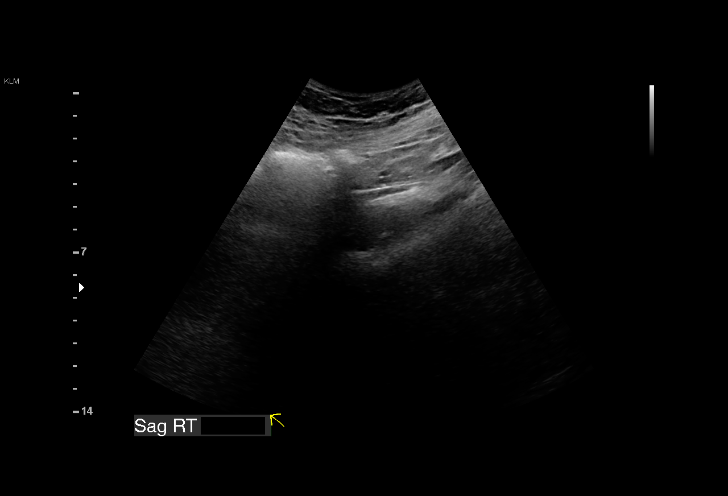
[im 12/40]
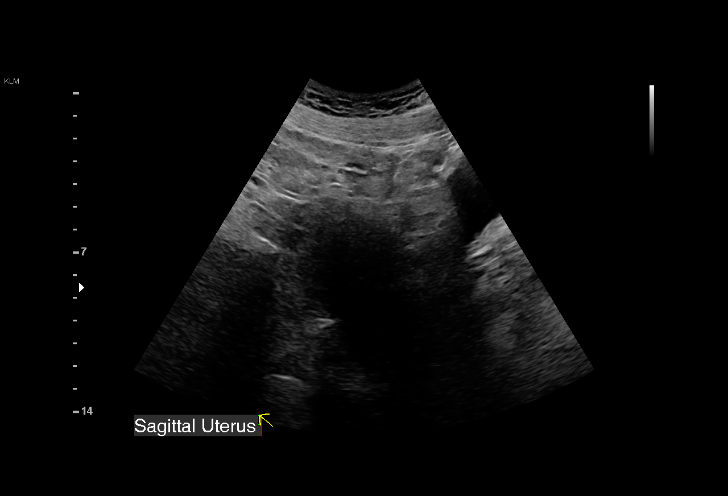
[im 15/40]
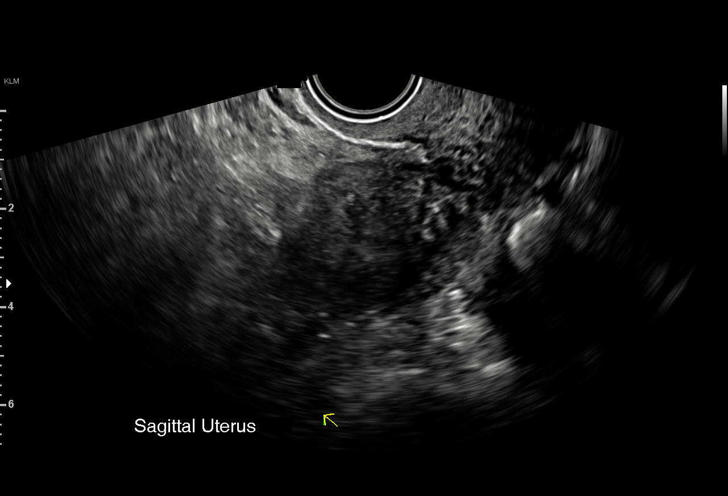
[im 18/40]
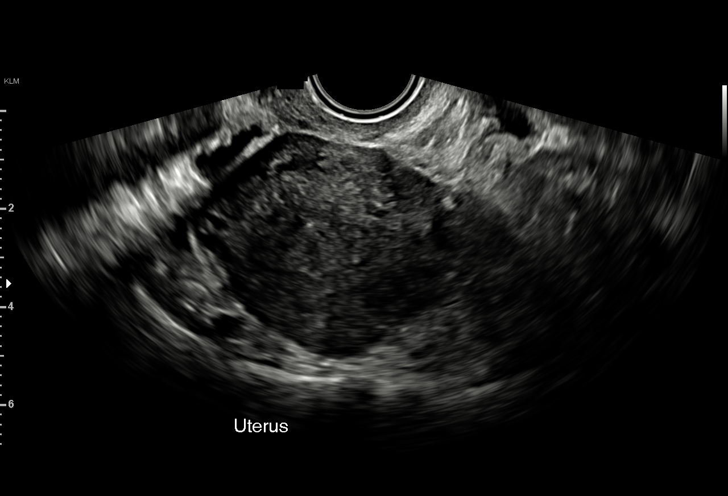
[im 21/40]
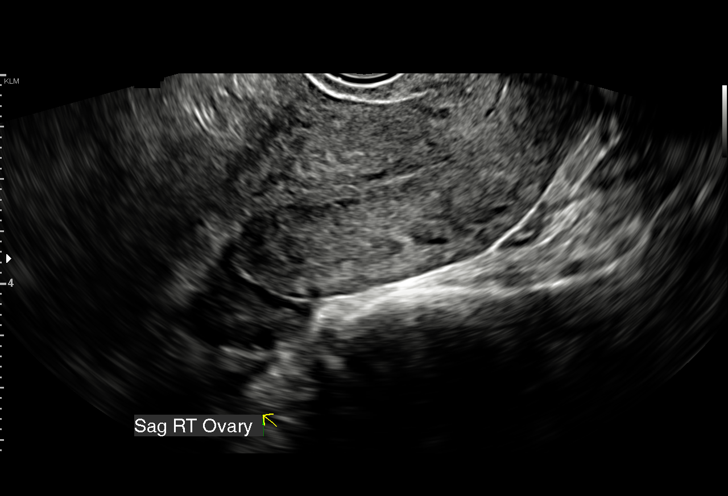
[im 22/40]
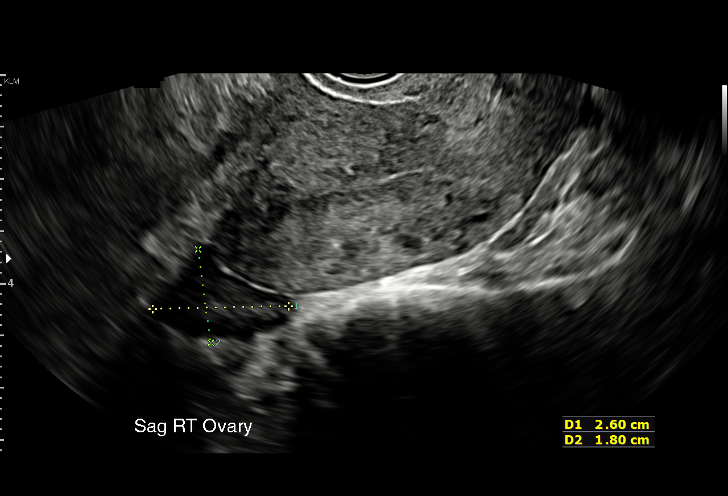
[im 25/40]
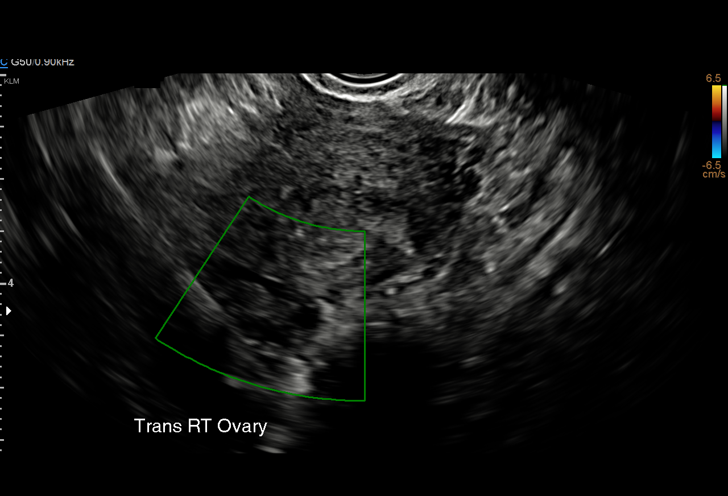
[im 28/40]
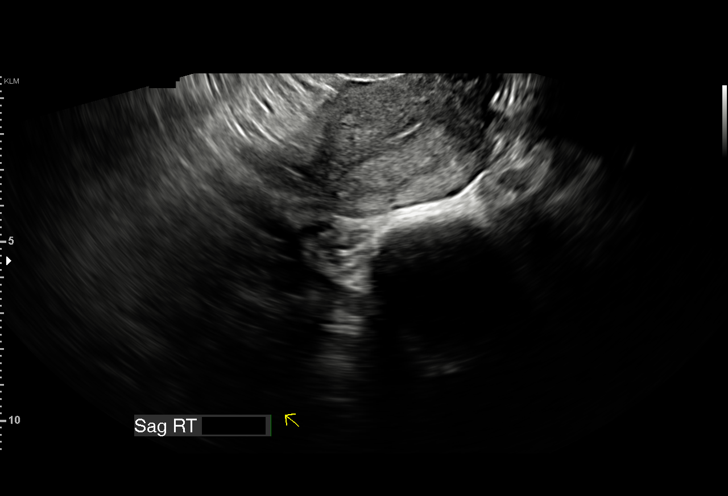
[im 31/40]
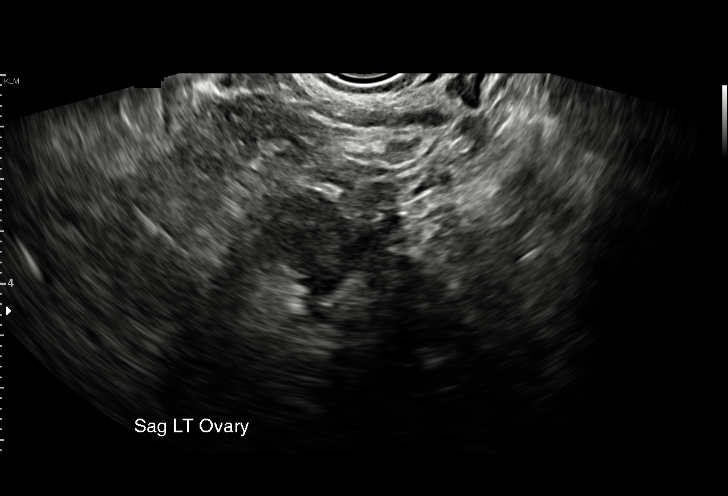
[im 34/40]
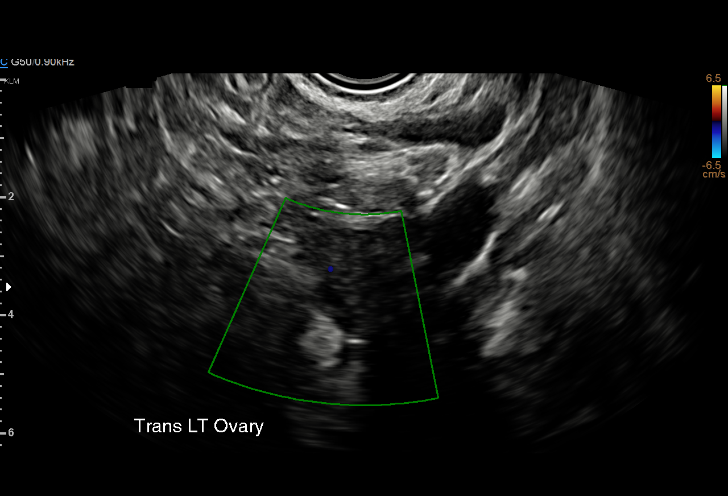
[im 37/40]
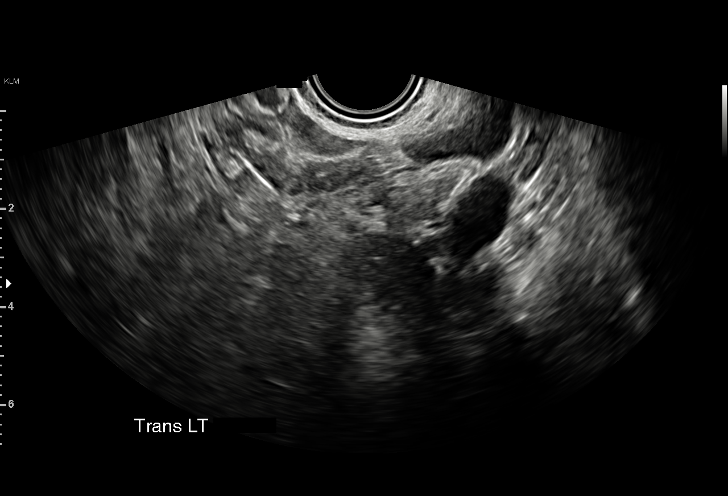
[im 40/40]
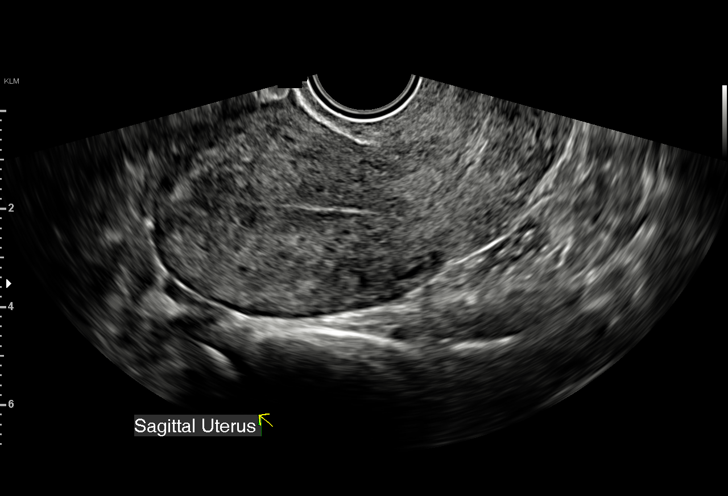

[15 of 28 positions shown; findings below may reference images not displayed]

FINDINGS: Intrauterine gestational sac: None

Yolk sac:  Not Visualized.

Embryo:  Not Visualized.

Cardiac Activity: Not Visualized.

Maternal uterus/adnexae: Ovaries are unremarkable. No free fluid is
noted.
IMPRESSION: No intrauterine gestational sac, yolk sac, fetal pole, or cardiac
activity visualized. Differential considerations include
intrauterine gestation too early to be sonographically visualized,
spontaneous abortion, or ectopic pregnancy. Consider follow-up
ultrasound in 14 days and serial quantitative beta HCG follow-up.
